# Patient Record
Sex: Female | Born: 1970 | Hispanic: No | Smoking: Never smoker
Health system: Southern US, Community
[De-identification: ages and names within clinical notes are randomized; demographics above are authoritative.]

## PROBLEM LIST (undated history)

## (undated) HISTORY — PX: APPENDECTOMY: SHX54

## (undated) HISTORY — PX: TUBAL LIGATION: SHX77

## (undated) HISTORY — PX: FOOT SURGERY: SHX648

## (undated) HISTORY — PX: CARPAL TUNNEL RELEASE: SHX101

## (undated) HISTORY — PX: ABDOMINAL HYSTERECTOMY: SHX81

## (undated) HISTORY — PX: BREAST SURGERY: SHX581

## (undated) HISTORY — PX: ABLATION: SHX5711

---

## 1998-02-09 ENCOUNTER — Emergency Department (HOSPITAL_COMMUNITY): Admission: EM | Admit: 1998-02-09 | Discharge: 1998-02-09 | Payer: Self-pay | Admitting: Emergency Medicine

## 2012-04-18 DIAGNOSIS — K648 Other hemorrhoids: Secondary | ICD-10-CM

## 2014-02-26 DIAGNOSIS — Z9013 Acquired absence of bilateral breasts and nipples: Secondary | ICD-10-CM

## 2014-06-25 DIAGNOSIS — F419 Anxiety disorder, unspecified: Secondary | ICD-10-CM

## 2014-06-25 DIAGNOSIS — M545 Low back pain, unspecified: Secondary | ICD-10-CM | POA: Insufficient documentation

## 2014-06-25 DIAGNOSIS — F319 Bipolar disorder, unspecified: Secondary | ICD-10-CM

## 2017-01-03 ENCOUNTER — Encounter: Payer: Self-pay | Admitting: Podiatry

## 2017-01-17 ENCOUNTER — Encounter: Payer: Self-pay | Admitting: Podiatry

## 2017-01-29 NOTE — Progress Notes (Signed)
This encounter was created in error - please disregard.

## 2017-02-05 ENCOUNTER — Ambulatory Visit (INDEPENDENT_AMBULATORY_CARE_PROVIDER_SITE_OTHER): Payer: Medicare Other

## 2017-02-05 ENCOUNTER — Ambulatory Visit (INDEPENDENT_AMBULATORY_CARE_PROVIDER_SITE_OTHER): Payer: Medicare Other | Admitting: Podiatry

## 2017-02-05 DIAGNOSIS — M722 Plantar fascial fibromatosis: Secondary | ICD-10-CM

## 2017-02-05 DIAGNOSIS — M79672 Pain in left foot: Principal | ICD-10-CM

## 2017-02-05 DIAGNOSIS — M79671 Pain in right foot: Principal | ICD-10-CM

## 2017-02-06 NOTE — Progress Notes (Signed)
   Subjective: Patient presents today for intermittent pain and tenderness in the bilateral plantar heels, left worse than right, that has been ongoing for the past 4 years. She states she is scheduled for bilateral EPF on Wednesday by a podiatrist in [facility]. She wants a second opinion. Patient presents today for further treatment and evaluation.   No past medical history on file.   Objective: Physical Exam General: The patient is alert and oriented x3 in no acute distress.  Dermatology: Skin is warm, dry and supple bilateral lower extremities. Negative for open lesions or macerations bilateral.   Vascular: Dorsalis Pedis and Posterior Tibial pulses palpable bilateral.  Capillary fill time is immediate to all digits.  Neurological: Epicritic and protective threshold intact bilateral.   Musculoskeletal: Tenderness to palpation at the medial calcaneal tubercale and through the insertion of the plantar fascia of the left foot. All other joints range of motion within normal limits bilateral. Strength 5/5 in all groups bilateral.    Assessment: 1. Plantar fasciitis left foot  Plan of Care:  1. Patient evaluated.  2. Recommended patient proceed with EPF surgery in [facility] by podiatrist. 3. Return to clinic when necessary.   Felecia Shelling, DPM [facility]  Dr. Felecia Shelling, DPM    [address] [place], [place] [address]                Office [phone]  Fax [phone]

## 2017-02-14 ENCOUNTER — Telehealth: Payer: Self-pay | Admitting: Podiatry

## 2017-02-15 ENCOUNTER — Telehealth: Payer: Self-pay | Admitting: Podiatry

## 2017-02-15 NOTE — Telephone Encounter (Signed)
Returned pt's phone call to discuss her request for her records to be sent to our office. Left voicemail saying she would need to sign a release form either with our office or the facility where she had her foot surgery at to have her records released to us. Asked pt to call me back directly at [phone] to let me know which she would rather do.

## 2017-02-15 NOTE — Telephone Encounter (Signed)
Called pt back in regards to her request of records she wants sent to our office. Pt stated she would come by the office to fill out and sign a medical records release form for us to fax to obtain her records. I told her to come by at her convenience that we are open until 5:00 pm today and we are open 7:30 am - 4:00 pm tomorrow.

## 2017-02-15 NOTE — Telephone Encounter (Signed)
[place] [facility] [place] week and I was wanting to get my records transferred up here since [place] to [place]. It would be easier on me. Please call me back at [phone]. Thank you.

## 2017-02-15 NOTE — Telephone Encounter (Signed)
Hello Shanda BumpsJessica, this is Kem Boroughsammy Bonham returning your call. My phone number is [phone]. Please give me a call back. Thank you.

## 2017-02-19 ENCOUNTER — Ambulatory Visit (INDEPENDENT_AMBULATORY_CARE_PROVIDER_SITE_OTHER): Payer: Medicare Other | Admitting: Podiatry

## 2017-02-19 VITALS — BP 104/64 | HR 101 | Temp 97.1°F

## 2017-02-19 DIAGNOSIS — M722 Plantar fascial fibromatosis: Secondary | ICD-10-CM

## 2017-02-19 DIAGNOSIS — Z9889 Other specified postprocedural states: Principal | ICD-10-CM

## 2017-02-21 NOTE — Progress Notes (Signed)
   Subjective:  Patient presents today status post left plantar fasciotomy performed [place] in [place]. DOS: 02/05/17. She states she is doing well and is here for further evaluation and treatment. Transfer of care was established today. Patient records faxed over to our office.  No past medical history on file.    Objective/Physical Exam Skin incisions appear to be well coapted with sutures and staples intact. No sign of infectious process noted. No dehiscence. No active bleeding noted. Moderate edema noted to the surgical extremity.   Assessment: 1. s/p left plantar fasciotomy performed [place] [facility]. DOS: 02/05/17.   Plan of Care:  1. Patient was evaluated.  2. Continue weightbearing cam boot. Discontinue knee scooter.  3. Recommended antibiotic ointment and Band-Aid daily. 4. Return to clinic in 1 week for suture removal.   Felecia ShellingBrent M. Leander Tout, DPM [facility]  Dr. Felecia ShellingBrent M. Davi Kroon, DPM    [address]                                        [place], [place] [address]                Office [phone]  Fax [phone](336) [phone]

## 2017-02-28 ENCOUNTER — Ambulatory Visit (INDEPENDENT_AMBULATORY_CARE_PROVIDER_SITE_OTHER): Payer: Medicare Other | Admitting: Podiatry

## 2017-02-28 DIAGNOSIS — M722 Plantar fascial fibromatosis: Secondary | ICD-10-CM

## 2017-02-28 DIAGNOSIS — Z9889 Other specified postprocedural states: Principal | ICD-10-CM

## 2017-03-02 NOTE — Progress Notes (Signed)
   Subjective:  Patient presents today status post left plantar fasciot[place] [facility]. DOS: 02/05/17. She states she is doing well but she is ready to discontinue wearing the boot. She is here for further evaluation and treatment.   No past medical history on file.     Objective/Physical Exam Skin incisions appear to be well coapted with sutures and staples intact. No sign of infectious process noted. No dehiscence. No active bleeding noted. Moderate edema noted to the surgical extremity.   Assessment: 1. s/p left plantar fasciotomy performed [place] [facility] [place]. DOS: 02/05/17.   Plan of Care:  1. Patient was evaluated.  2. Sutures were removed. 3. Return to normal shoe gear. Discontinue wearing cam boot. 4. Return to clinic when necessary.   Felecia ShellingBrent M. Verner Kopischke, DPM [facility]  Dr. Felecia ShellingBrent M. Eyal Greenhaw, DPM    [address]                                        [place], [place] [address]                Office [phone](336) [phone]  Fax [phone](336) [phone]

## 2017-05-28 ENCOUNTER — Encounter: Payer: Medicare Other | Admitting: Podiatry

## 2017-06-04 ENCOUNTER — Other Ambulatory Visit: Payer: Self-pay | Admitting: Podiatry

## 2017-06-04 ENCOUNTER — Ambulatory Visit (INDEPENDENT_AMBULATORY_CARE_PROVIDER_SITE_OTHER): Payer: Medicare Other | Admitting: Podiatry

## 2017-06-04 ENCOUNTER — Ambulatory Visit: Payer: Medicare Other

## 2017-06-04 DIAGNOSIS — R52 Pain, unspecified: Principal | ICD-10-CM

## 2017-06-04 DIAGNOSIS — B070 Plantar wart: Principal | ICD-10-CM

## 2017-06-04 DIAGNOSIS — M722 Plantar fascial fibromatosis: Secondary | ICD-10-CM

## 2017-06-04 MED ORDER — DICLOFENAC SODIUM 75 MG PO TBEC: 75.0000 mg | DELAYED_RELEASE_TABLET | Freq: Two times a day (BID) | ORAL | 1 refills | Status: DC

## 2017-06-04 NOTE — Progress Notes (Deleted)
le

## 2017-06-05 ENCOUNTER — Telehealth: Payer: Self-pay | Admitting: Podiatry

## 2017-06-05 MED ORDER — TRAMADOL HCL 50 MG PO TABS: 50.0000 mg | ORAL_TABLET | Freq: Four times a day (QID) | ORAL | 0 refills | Status: DC | PRN

## 2017-06-05 NOTE — Telephone Encounter (Signed)
I instructed pt to begin soak, and could ice the area 3-4 time daily for 15-20 minutes/session protecting the area from the ice with a sock and I would inform Dr. Logan BoresEvans.

## 2017-06-05 NOTE — Addendum Note (Signed)
Addended by: Alphia Kava'CONNELL, Phung Kotas D on: 06/05/2017 04:26 PM   Modules accepted: Orders

## 2017-06-05 NOTE — Telephone Encounter (Signed)
Left message informing pt Dr. Logan BoresEvans ordered Tramadol and it would be sent to [facility] [id number].

## 2017-06-05 NOTE — Telephone Encounter (Signed)
I was in yesterday and my foot is still in a lot of pain. I was wondering if there is any way he can send some pain medication in because the medication he gave me is not helping my foot. My number is [phone].

## 2017-06-05 NOTE — Telephone Encounter (Signed)
Dr. Philomena DohenyEvans okayed Tramadol 50mg  #30 one every 6 hours.

## 2017-06-05 NOTE — Telephone Encounter (Signed)
Faxed order for tramadol to [facility] [address].

## 2017-06-06 NOTE — Progress Notes (Signed)
   Subjective: Patient presents today for pain and tenderness in the left heel that began 3 weeks ago. She reports small nodules near the incision site of the left heel from plantar fasciotomy she had done [place] [facility] [place] on 02/05/17. Applying pressure to the foot increases the pain. There are no alleviating factors noted. She has not done anything to treat the symptoms. Patient presents today for further treatment and evaluation.   No past medical history on file.   Objective: Physical Exam General: The patient is alert and oriented x3 in no acute distress.  Dermatology: Hyperkeratotic skin lesion noted to the plantar aspect of the left foot approximately 1 cm in diameter. Pinpoint bleeding noted upon debridement. Skin is warm, dry and supple bilateral lower extremities. Negative for open lesions or macerations bilateral.   Vascular: Dorsalis Pedis and Posterior Tibial pulses palpable bilateral.  Capillary fill time is immediate to all digits.  Neurological: Epicritic and protective threshold intact bilateral.   Musculoskeletal: Tenderness to palpation at the medial calcaneal tubercale and through the insertion of the plantar fascia of the left foot. All other joints range of motion within normal limits bilateral. Strength 5/5 in all groups bilateral.    Assessment: 1. Plantar fasciitis left foot 2. Plantar wart left foot 3. H/o EPF 02/05/17   Plan of Care:  1. Patient evaluated.  2. Plantar fascial brace dispensed. 3. Prescription for Diclofenac provided to patient. 4. Injections in the past have not helped.  5. Excisional debridement of the plantar wart lesion was performed using a chisel blade. Cantharone was applied and the lesion was dressed with a dry sterile dressing. 6. Return to clinic in 2 weeks.    Felecia ShellingBrent M. Evans, DPM [facility]  Dr. Felecia ShellingBrent M. Evans, DPM    [address]                                     [place], [place] [address]                  Office [phone]  Fax [phone]

## 2017-06-18 ENCOUNTER — Ambulatory Visit (INDEPENDENT_AMBULATORY_CARE_PROVIDER_SITE_OTHER): Payer: Medicare Other | Admitting: Podiatry

## 2017-06-18 DIAGNOSIS — B070 Plantar wart: Principal | ICD-10-CM

## 2017-06-18 DIAGNOSIS — M722 Plantar fascial fibromatosis: Secondary | ICD-10-CM

## 2017-06-18 MED ORDER — TRAMADOL HCL 50 MG PO TABS: 50.0000 mg | ORAL_TABLET | Freq: Three times a day (TID) | ORAL | 0 refills | Status: DC | PRN

## 2017-06-20 NOTE — Progress Notes (Signed)
   Subjective: 47 year old female presenting today for follow up evaluation of left plantar fasciitis and a plantar wart of the left foot. She reports the blister burst over the past few days and now has itching of the area. She has bene wearing the fascial brace on the foot which caused the blister to rupture. Patient presents today for further treatment and evaluation.   No past medical history on file.   Objective: Physical Exam General: The patient is alert and oriented x3 in no acute distress.  Dermatology: Hyperkeratotic skin lesion noted to the plantar aspect of the left foot approximately 1 cm in diameter. Pinpoint bleeding noted upon debridement. Skin is warm, dry and supple bilateral lower extremities. Negative for open lesions or macerations bilateral.   Vascular: Dorsalis Pedis and Posterior Tibial pulses palpable bilateral.  Capillary fill time is immediate to all digits.  Neurological: Epicritic and protective threshold intact bilateral.   Musculoskeletal: Tenderness to palpation at the medial calcaneal tubercale and through the insertion of the plantar fascia of the left foot. All other joints range of motion within normal limits bilateral. Strength 5/5 in all groups bilateral.    Assessment: 1. Plantar fasciitis left foot 2. Plantar wart left foot - improved 3. H/o EPF 02/05/17   Plan of Care:  1. Patient evaluated.  2. Continue wearing plantar fascial brace. 3. Salinocaine applied to the wart lesion and dressed with a dry sterile dressing.  4. Recommended good shoe gear.  5. Refill prescription for tramadol 50 mg #60 provided to patient.  6. Return to clinic as needed.     Felecia ShellingBrent M. Emmajo Bennette, DPM [facility]  Dr. Felecia ShellingBrent M. Deborah Dondero, DPM    [address]                                     [place], [place] [address]                Office [phone]) [phone]  Fax [phone]

## 2017-07-09 NOTE — Progress Notes (Signed)
This encounter was created in error - please disregard.

## 2018-09-16 ENCOUNTER — Other Ambulatory Visit: Payer: Self-pay

## 2018-09-16 ENCOUNTER — Encounter: Payer: Self-pay | Admitting: Podiatry

## 2018-09-16 ENCOUNTER — Ambulatory Visit (INDEPENDENT_AMBULATORY_CARE_PROVIDER_SITE_OTHER): Payer: Medicare Other | Admitting: Podiatry

## 2018-09-16 VITALS — Temp 97.5°F

## 2018-09-16 DIAGNOSIS — M722 Plantar fascial fibromatosis: Secondary | ICD-10-CM

## 2018-09-16 DIAGNOSIS — R252 Cramp and spasm: Secondary | ICD-10-CM | POA: Diagnosis not present

## 2018-09-16 MED ORDER — MELOXICAM 15 MG PO TABS: 15.0000 mg | ORAL_TABLET | Freq: Every day | ORAL | 1 refills | Status: DC

## 2018-09-16 MED ORDER — CYCLOBENZAPRINE HCL 5 MG PO TABS: 5.0000 mg | ORAL_TABLET | Freq: Every day | ORAL | 1 refills | Status: DC

## 2018-09-18 NOTE — Progress Notes (Signed)
   Subjective: 48 y.o. female presenting today for follow up evaluation of left plantar fasciitis. She reports continued pain if she is on them for long periods of time. She has tried stretching but reports no significant improvement. She reports associated cramping of her toes that worsens at night. Patient is here for further evaluation and treatment.   History reviewed. No pertinent past medical history.   Objective: Physical Exam General: The patient is alert and oriented x3 in no acute distress.  Dermatology: Skin is warm, dry and supple bilateral lower extremities. Negative for open lesions or macerations bilateral.   Vascular: Dorsalis Pedis and Posterior Tibial pulses palpable bilateral.  Capillary fill time is immediate to all digits.  Neurological: Epicritic and protective threshold intact bilateral.   Musculoskeletal: Tenderness to palpation to the plantar aspect of the left heel along the plantar fascia. All other joints range of motion within normal limits bilateral. Strength 5/5 in all groups bilateral.   Assessment: 1. Plantar fasciitis left foot 2. Nocturnal foot cramps right   Plan of Care:  1. Patient evaluated.   2. Injection of 0.5cc Celestone soluspan injected into the left plantar fascia.  3. Rx for Flexeril 5mg  QHS provided to patient.  4. Rx for Meloxicam ordered for patient. 5. Plantar fascial band(s) dispensed  6. Instructed patient regarding therapies and modalities at home to alleviate symptoms.  7. Return to clinic in 4 weeks. If not better, I recommend surgery for the plantar fasciitis of the left foot.    Felecia Shelling, DPM [facility]  Dr. Felecia Shelling, DPM    [address] [place], [place] [address]                Office [phone]  Fax [phone]

## 2018-10-06 ENCOUNTER — Emergency Department (HOSPITAL_COMMUNITY): Payer: Medicare Other

## 2018-10-06 ENCOUNTER — Other Ambulatory Visit: Payer: Self-pay

## 2018-10-06 ENCOUNTER — Encounter (HOSPITAL_COMMUNITY): Payer: Self-pay | Admitting: *Deleted

## 2018-10-06 ENCOUNTER — Emergency Department (HOSPITAL_COMMUNITY)
Admission: EM | Admit: 2018-10-06 | Discharge: 2018-10-06 | Disposition: A | Discharge status: Home / Self Care | Payer: Medicare Other | Attending: Emergency Medicine | Admitting: Emergency Medicine

## 2018-10-06 DIAGNOSIS — S99921A Unspecified injury of right foot, initial encounter: Secondary | ICD-10-CM | POA: Diagnosis present

## 2018-10-06 DIAGNOSIS — S93691A Other sprain of right foot, initial encounter: Secondary | ICD-10-CM | POA: Insufficient documentation

## 2018-10-06 DIAGNOSIS — Y92008 Other place in unspecified non-institutional (private) residence as the place of occurrence of the external cause: Secondary | ICD-10-CM | POA: Insufficient documentation

## 2018-10-06 DIAGNOSIS — Z79899 Other long term (current) drug therapy: Secondary | ICD-10-CM | POA: Diagnosis not present

## 2018-10-06 DIAGNOSIS — Y9389 Activity, other specified: Secondary | ICD-10-CM | POA: Insufficient documentation

## 2018-10-06 DIAGNOSIS — X509XXA Other and unspecified overexertion or strenuous movements or postures, initial encounter: Secondary | ICD-10-CM | POA: Insufficient documentation

## 2018-10-06 DIAGNOSIS — S93601A Unspecified sprain of right foot, initial encounter: Secondary | ICD-10-CM

## 2018-10-06 DIAGNOSIS — Y999 Unspecified external cause status: Secondary | ICD-10-CM | POA: Insufficient documentation

## 2018-10-06 MED ORDER — NAPROXEN 500 MG PO TABS: 500.0000 mg | ORAL_TABLET | Freq: Two times a day (BID) | ORAL | 0 refills | Status: DC | PRN

## 2018-10-06 MED ORDER — IBUPROFEN 400 MG PO TABS
400.0000 mg | ORAL_TABLET | Freq: Once | ORAL | Status: AC
  Administered 2018-10-06: 01:00:00 400 mg via ORAL
  Filled 2018-10-06: qty 1

## 2018-10-06 NOTE — ED Provider Notes (Signed)
[facility] EMERGENCY DEPARTMENT Provider Note   CSN: [id number] Arrival date & time: 10/06/18  0051    History   Chief Complaint Chief Complaint  Patient presents with   Foot Pain    HPI Sandy Phillips is a 48 y.o. female.     With right-sided foot and ankle pain after twisting it coming up the stairs tonight.  States she was wearing slippers and lost her balance and her right foot turned inward.  She fell backwards and was caught by her friend and did not fall or hit her head or lose consciousness.  She complains of pain to the bottom of her right foot and medial right ankle.  This happened about midnight.  She denies hitting her head.  No neck or back pain.  No focal weakness, numbness or tingling.  She did not take anything at home for pain.  The history is provided by the patient.  Foot Pain  Pertinent negatives include no chest pain and no headaches.    History reviewed. No pertinent past medical history.  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      Home Medications    Prior to Admission medications   Medication Sig Start Date End Date Taking? Authorizing Provider  ALPRAZolam Duanne Moron) 0.5 MG tablet Take 0.5 mg by mouth at bedtime as needed for anxiety.    [provider]  cyclobenzaprine (FLEXERIL) 5 MG tablet Take 1 tablet (5 mg total) by mouth at bedtime. 09/16/18   Edrick Kins, DPM  diclofenac (VOLTAREN) 75 MG EC tablet TAKE 1 TABLET BY MOUTH TWICE DAILY 06/04/17   Edrick Kins, DPM  meloxicam (MOBIC) 15 MG tablet Take 1 tablet  (15 mg total) by mouth daily. 09/16/18   Edrick Kins, DPM  sertraline (ZOLOFT) 50 MG tablet Take by mouth. 01/29/17   [provider]  traMADol (ULTRAM) 50 MG tablet Take 1 tablet (50 mg total) by mouth every 8 (eight) hours as needed. 06/18/17   Edrick Kins, DPM    Family History No family history on file.  Social History Social History   Tobacco Use   Smoking status: Never Smoker   Smokeless tobacco: Never Used  Substance Use Topics   Alcohol use: Not on file   Drug use: Not on file     Allergies   Celexa  [citalopram hydrobromide]; Penicillins; Marcaine  [bupivacaine hcl]; Codeine; Lidocaine; and Hydrocodone-acetaminophen   Review of Systems Review of Systems  Constitutional: Negative for activity change, appetite change and fever.  HENT: Negative for congestion and rhinorrhea.   Respiratory: Negative for chest tightness.   Cardiovascular: Negative for chest pain.  Gastrointestinal: Negative for nausea and vomiting.  Genitourinary: Negative for dysuria.  Musculoskeletal: Positive for arthralgias and myalgias.  Skin: Negative for rash.  Neurological: Negative for dizziness, weakness and headaches.   all other systems are negative except as noted in the HPI and PMH.     Physical  Exam Updated Vital Signs BP 135/74 (BP Location: Left Arm)    Pulse (!) 101    Temp 98 F (36.7 C) (Oral)    Resp 18    Ht 5\' 2"  (1.575 m)    Wt 77.1 kg    SpO2 97%    BMI 31.09 kg/m   Physical Exam Vitals signs and nursing note reviewed.  Constitutional:      General: She is not in acute distress.    Appearance: She is well-developed.  HENT:     Head: Normocephalic and atraumatic.     Mouth/Throat:     Pharynx: No oropharyngeal exudate.  Eyes:     Conjunctiva/sclera: Conjunctivae normal.     Pupils: Pupils are equal, round, and reactive to light.  Neck:     Musculoskeletal: Normal range of motion and neck supple.     Comments: No C-spine tenderness Cardiovascular:       Rate and Rhythm: Normal rate and regular rhythm.     Heart sounds: Normal heart sounds. No murmur.  Pulmonary:     Effort: Pulmonary effort is normal. No respiratory distress.     Breath sounds: Normal breath sounds.  Abdominal:     Palpations: Abdomen is soft.     Tenderness: There is no abdominal tenderness. There is no guarding or rebound.  Musculoskeletal: Normal range of motion.        General: Tenderness present.     Comments: Tenderness to palpation of the right plantar foot without deformity.  Intact DP and PT pulse.  Achilles is intact.  Medial malleolar tenderness without swelling or deformity.  No proximal fibular tenderness  No T or L-spine tenderness  Skin:    General: Skin is warm.     Capillary Refill: Capillary refill takes less than 2 seconds.  Neurological:     General: No focal deficit present.     Mental Status: She is alert and oriented to person, place, and time. Mental status is at baseline.     Cranial Nerves: No cranial nerve deficit.     Motor: No abnormal muscle tone.     Coordination: Coordination normal.     Comments:  5/5 strength throughout. CN 2-12 intact.Equal grip strength.   Psychiatric:        Behavior: Behavior normal.      ED Treatments / Results  Labs (all labs ordered are listed, but only abnormal results are displayed) Labs Reviewed - No data to display  EKG None  Radiology Dg Ankle Complete Right  Result Date: 10/06/2018 CLINICAL DATA:  48 year old female with history of twist injury to the right foot complaining of pain on the bottom of the right foot. EXAM: RIGHT FOOT COMPLETE - 3+ VIEW; RIGHT ANKLE - COMPLETE 3+ VIEW COMPARISON:  02/05/2017. FINDINGS: There is no evidence of fracture or dislocation. There is no evidence of arthropathy or other focal bone abnormality. Soft tissues are unremarkable. IMPRESSION: Negative. Electronically Signed   By: Trudie Reedaniel  Entrikin M.D.   On: 10/06/2018 02:05   Dg Foot Complete Right  Result  Date: 10/06/2018 CLINICAL DATA:  48 year old female with history of twist injury to the right foot complaining of pain on the bottom of the right foot. EXAM: RIGHT FOOT COMPLETE - 3+ VIEW; RIGHT ANKLE - COMPLETE 3+ VIEW COMPARISON:  02/05/2017. FINDINGS: There is no evidence of fracture or dislocation. There is no evidence of arthropathy or other focal bone abnormality. Soft tissues are unremarkable. IMPRESSION: Negative. Electronically Signed   By: Reuel Boomaniel  Entrikin M.D.   On: 10/06/2018 02:05    Procedures Procedures (including critical care time)  Medications Ordered in ED Medications  ibuprofen (ADVIL) tablet 400 mg (has no administration in time range)     Initial Impression / Assessment and Plan / ED Course  I have reviewed the triage vital signs and the nursing notes.  Pertinent labs & imaging results that were available during my care of the patient were reviewed by me and considered in my medical decision making (see chart for details).       Fall with right foot pain.  Neurovascularly intact.  No other injuries.  X-ray of right foot and ankle are negative for fracture dislocation.  Patient will be treated for suspected sprain.  Discussed she may have damaged some the ligaments in her foot and should follow-up with the foot specialist.  She is given a hard soled shoe and crutches.  She will be treated with anti-inflammatories, ice, elevation.  Return precautions discussed. Final Clinical Impressions(s) / ED Diagnoses   Final diagnoses:  Sprain of right foot, initial encounter    ED Discharge Orders    None       [place], Jeannett SeniorStephen, MD 10/06/18 [phone]

## 2018-10-06 NOTE — Discharge Instructions (Signed)
Take the anti-inflammatories as prescribed and follow-up with the foot doctor.  Return to the ED with worsening pain, numbness, tingling, any other concerns.

## 2018-10-06 NOTE — ED Triage Notes (Signed)
Pt twisted right foot tonight, c/o pain to bottom of right foot,

## 2018-10-17 ENCOUNTER — Other Ambulatory Visit: Payer: Self-pay

## 2018-10-17 MED ORDER — MELOXICAM 15 MG PO TABS: 15.0000 mg | ORAL_TABLET | Freq: Every day | ORAL | 1 refills | Status: DC

## 2018-10-17 NOTE — Progress Notes (Signed)
Refill on Rx Meloxicam 15MG  30tab

## 2018-10-21 ENCOUNTER — Ambulatory Visit: Payer: Medicare Other | Admitting: Podiatry

## 2018-10-25 ENCOUNTER — Other Ambulatory Visit: Payer: Self-pay

## 2018-10-25 ENCOUNTER — Encounter: Payer: Self-pay | Admitting: Podiatry

## 2018-10-25 ENCOUNTER — Ambulatory Visit (INDEPENDENT_AMBULATORY_CARE_PROVIDER_SITE_OTHER): Payer: Medicare Other | Admitting: Podiatry

## 2018-10-25 VITALS — Temp 97.6°F

## 2018-10-25 DIAGNOSIS — M722 Plantar fascial fibromatosis: Secondary | ICD-10-CM

## 2018-10-25 DIAGNOSIS — R252 Cramp and spasm: Secondary | ICD-10-CM

## 2018-10-31 NOTE — Progress Notes (Signed)
Subjective:   Patient ID: Sandy Phillips, female   DOB: 48 y.o.   MRN: [id number]   HPI Patient states I seem to be improved but I am having discomfort if I am on my foot too long   ROS      Objective:  Physical Exam  Neurovascular status intact negative Homans sign noted with patient's foot moderately tender but only after activity or prolonged standing     Assessment:  Appears to be a local fascial process but does not appear to be something with acute intensity     Plan:  Spent a great deal time going over education of deformity considerations for long-term treatments but at this time we will utilize physical therapy anti-inflammatory support therapy and will be seen back as needed

## 2018-11-20 ENCOUNTER — Encounter: Payer: Self-pay | Admitting: Podiatry

## 2018-11-20 ENCOUNTER — Ambulatory Visit (INDEPENDENT_AMBULATORY_CARE_PROVIDER_SITE_OTHER): Payer: Medicare Other | Admitting: Podiatry

## 2018-11-20 ENCOUNTER — Other Ambulatory Visit: Payer: Self-pay

## 2018-11-20 DIAGNOSIS — M722 Plantar fascial fibromatosis: Secondary | ICD-10-CM | POA: Diagnosis not present

## 2018-11-20 NOTE — Patient Instructions (Signed)
Pre-Operative Instructions  Congratulations, you have decided to take an important step towards improving your quality of life.  You can be assured that the doctors and staff at [facility] will be with you every step of the way.  Here are some important things you should know:  1. Plan to be at the surgery center/hospital at least 1 (one) hour prior to your scheduled time, unless otherwise directed by the surgical center/hospital staff.  You must have a responsible adult accompany you, remain during the surgery and drive you home.  Make sure you have directions to the surgical center/hospital to ensure you arrive on time. 2. If you are having surgery at [facility] or [facility], you will need a copy of your medical history and physical form from your family physician within one month prior to the date of surgery. We will give you a form for your primary physician to complete.  3. We make every effort to accommodate the date you request for surgery.  However, there are times where surgery dates or times have to be moved.  We will contact you as soon as possible if a change in schedule is required.   4. No aspirin/ibuprofen for one week before surgery.  If you are on aspirin, any non-steroidal anti-inflammatory medications (Mobic, Aleve, Ibuprofen) should not be taken seven (7) days prior to your surgery.  You make take Tylenol for pain prior to surgery.  5. Medications - If you are taking daily heart and blood pressure medications, seizure, reflux, allergy, asthma, anxiety, pain or diabetes medications, make sure you notify the surgery center/hospital before the day of surgery so they can tell you which medications you should take or avoid the day of surgery. 6. No food or drink after midnight the night before surgery unless directed otherwise by surgical center/hospital staff. 7. No alcoholic beverages 24-hours prior to surgery.  No smoking 24-hours prior or 24-hours after  surgery. 8. Wear loose pants or shorts. They should be loose enough to fit over bandages, boots, and casts. 9. Don't wear slip-on shoes. Sneakers are preferred. 10. Bring your boot with you to the surgery center/hospital.  Also bring crutches or a walker if your physician has prescribed it for you.  If you do not have this equipment, it will be provided for you after surgery. 11. If you have not been contacted by the surgery center/hospital by the day before your surgery, call to confirm the date and time of your surgery. 12. Leave-time from work may vary depending on the type of surgery you have.  Appropriate arrangements should be made prior to surgery with your employer. 13. Prescriptions will be provided immediately following surgery by your doctor.  Fill these as soon as possible after surgery and take the medication as directed. Pain medications will not be refilled on weekends and must be approved by the doctor. 14. Remove nail polish on the operative foot and avoid getting pedicures prior to surgery. 15. Wash the night before surgery.  The night before surgery wash the foot and leg well with water and the antibacterial soap provided. Be sure to pay special attention to beneath the toenails and in between the toes.  Wash for at least three (3) minutes. Rinse thoroughly with water and dry well with a towel.  Perform this wash unless told not to do so by your physician.  Enclosed: 1 Ice pack (please put in freezer the night before surgery)   1 Hibiclens skin cleaner     Pre-op instructions  If you have any questions regarding the instructions, please do not hesitate to call our office.  [place]: [address], [place], [place] [address] -- [phone]  [place]: [address] [place], [place] [address] -- [phone]  [place]: [address]  [place], [place] [address] -- [phone]  [place]: [address], [address], [place], [place] [address] -- [phone]  Website:  [email or web address] 

## 2018-11-27 NOTE — Progress Notes (Signed)
   Subjective: 48 y.o. female presenting today for follow up evaluation of bilateral plantar fasciitis, right worse than left. She states the pain has not changed since her last visit with Dr. Paulla Dolly on 10/25/2018. She reports using the fascial brace but states it rubs the posterior heel. She has been taking anti-inflammatories for treatment. Patient is here for further evaluation and treatment.   History reviewed. No pertinent past medical history.   Objective: Physical Exam General: The patient is alert and oriented x3 in no acute distress.  Dermatology: Skin is warm, dry and supple bilateral lower extremities. Negative for open lesions or macerations bilateral.   Vascular: Dorsalis Pedis and Posterior Tibial pulses palpable bilateral.  Capillary fill time is immediate to all digits.  Neurological: Epicritic and protective threshold intact bilateral.   Musculoskeletal: Tenderness to palpation to the plantar aspect of the right heel along the plantar fascia. All other joints range of motion within normal limits bilateral. Strength 5/5 in all groups bilateral.   Assessment: 1. Plantar fasciitis right 2. H/o EPF left October 2018  Plan of Care:  1. Patient evaluated.  2. Today we discussed the conservative versus surgical management of the presenting pathology. The patient opts for surgical management. All possible complications and details of the procedure were explained. All patient questions were answered. No guarantees were expressed or implied. 3. Authorization for surgery was initiated today. Surgery will consist of EPF right.  4. Return to clinic one week post op.     Edrick Kins, DPM [facility]  Dr. Edrick Kins, DPM    [address], [place] [address]                Office [phone]  Fax [phone]

## 2018-12-29 DIAGNOSIS — Z720 Tobacco use: Secondary | ICD-10-CM | POA: Insufficient documentation

## 2019-01-09 ENCOUNTER — Encounter: Payer: Self-pay | Admitting: Podiatry

## 2019-01-09 ENCOUNTER — Other Ambulatory Visit: Payer: Self-pay | Admitting: Podiatry

## 2019-01-09 DIAGNOSIS — M722 Plantar fascial fibromatosis: Secondary | ICD-10-CM

## 2019-01-09 MED ORDER — ZOLPIDEM TARTRATE 5 MG PO TABS: 5.0000 mg | ORAL_TABLET | Freq: Every evening | ORAL | 1 refills | Status: DC | PRN

## 2019-01-09 MED ORDER — OXYCODONE-ACETAMINOPHEN 5-325 MG PO TABS: 1.0000 | ORAL_TABLET | Freq: Four times a day (QID) | ORAL | 0 refills | Status: DC | PRN

## 2019-01-09 NOTE — Progress Notes (Signed)
.  postop

## 2019-01-10 ENCOUNTER — Telehealth: Payer: Self-pay | Admitting: Podiatry

## 2019-01-10 NOTE — Telephone Encounter (Signed)
Pt called stating the boot is digging into her and she had to put padding under her heel. I told her to come in today by 4:30 to see one of the assistants that she may need a different sized boot. Instructed her to stop at the check in desk and let them know.

## 2019-01-13 ENCOUNTER — Other Ambulatory Visit: Payer: Self-pay

## 2019-01-13 ENCOUNTER — Ambulatory Visit (INDEPENDENT_AMBULATORY_CARE_PROVIDER_SITE_OTHER): Payer: Self-pay | Admitting: Podiatry

## 2019-01-13 ENCOUNTER — Encounter: Payer: Self-pay | Admitting: Podiatry

## 2019-01-13 DIAGNOSIS — M722 Plantar fascial fibromatosis: Secondary | ICD-10-CM

## 2019-01-13 DIAGNOSIS — Z9889 Other specified postprocedural states: Secondary | ICD-10-CM

## 2019-01-15 NOTE — Progress Notes (Signed)
   Subjective:  Patient presents today status post EPF right. DOS: 01/09/2019. She states she is doing well. She reports some mild tenderness but denies modifying factors. She has been using the CAM boot as directed. Patient is here for further evaluation and treatment.    History reviewed. No pertinent past medical history.    Objective/Physical Exam Neurovascular status intact.  Skin incisions appear to be well coapted with sutures and staples intact. No sign of infectious process noted. No dehiscence. No active bleeding noted. Moderate edema noted to the surgical extremity.  Assessment: 1. s/p EPF right. DOS: 01/09/2019 2. H/o EPF left October 2018   Plan of Care:  1. Patient was evaluated. X-rays reviewed 2. Dressing changed.  3. Continue weightbearing in CAM boot.  4. Return to clinic in one week for suture removal.    Edrick Kins, DPM [facility]  Dr. Edrick Kins, [address]                                        [place], [place] [address]                Office [phone]  Fax [phone]

## 2019-01-16 ENCOUNTER — Telehealth: Payer: Self-pay | Admitting: Podiatry

## 2019-01-16 NOTE — Telephone Encounter (Signed)
Pt was seen in office earlier this week and had her foot wrapped in an Ace bandage but noticed she is starting to get a rash/bumps on the top of her foot. Pt believes she is having a reaction to the Ace bandage and wants to know what she should do. Pt had surgery on 01/09/19.   Please give patient a call

## 2019-01-16 NOTE — Telephone Encounter (Signed)
I told pt that if she was continuing to have swelling she would need to wear a thin cotton sock beneath the ace wrap, but if no swelling she could try to go without the ace wrap. Pt states understanding and requested pain medication that was to be called in.

## 2019-01-22 ENCOUNTER — Ambulatory Visit (INDEPENDENT_AMBULATORY_CARE_PROVIDER_SITE_OTHER): Payer: Medicare Other | Admitting: Podiatry

## 2019-01-22 ENCOUNTER — Encounter: Payer: Self-pay | Admitting: Podiatry

## 2019-01-22 ENCOUNTER — Other Ambulatory Visit: Payer: Self-pay

## 2019-01-22 DIAGNOSIS — M722 Plantar fascial fibromatosis: Secondary | ICD-10-CM

## 2019-01-22 DIAGNOSIS — Z9889 Other specified postprocedural states: Secondary | ICD-10-CM

## 2019-01-22 MED ORDER — OXYCODONE-ACETAMINOPHEN 5-325 MG PO TABS: 1.0000 | ORAL_TABLET | Freq: Four times a day (QID) | ORAL | 0 refills | Status: DC | PRN

## 2019-01-26 NOTE — Progress Notes (Signed)
   Subjective:  Patient presents today status post EPF right. DOS: 01/09/2019. She states she is doing well. She reports some mild intermittent soreness but states it is not significant. She has been using the CAM boot as directed and denies any worsening factors. Patient is here for further evaluation and treatment.   History reviewed. No pertinent past medical history.    Objective/Physical Exam Neurovascular status intact.  Skin incisions appear to be well coapted with sutures and staples intact. No sign of infectious process noted. No dehiscence. No active bleeding noted. Moderate edema noted to the surgical extremity.  Assessment: 1. s/p EPF right. DOS: 01/09/2019 2. H/o EPF left October 2018   Plan of Care:  1. Patient was evaluated.  2. Sutures removed.  3. Continue using CAM boot for two weeks.  4. Refill prescription for Percocet 5/325 mg.  5. Return to clinic in 4 weeks.    Edrick Kins, DPM [facility]  Dr. Edrick Kins, [place]                                        [place], [place] [address]                Office [phone]  Fax [phone]

## 2019-01-28 ENCOUNTER — Emergency Department (HOSPITAL_COMMUNITY)
Admission: EM | Admit: 2019-01-28 | Discharge: 2019-01-28 | Discharge status: Absent without leave | Payer: Medicare Other

## 2019-01-28 ENCOUNTER — Ambulatory Visit
Admission: EM | Admit: 2019-01-28 | Discharge: 2019-01-28 | Disposition: A | Discharge status: Home / Self Care | Payer: Medicare Other | Attending: Emergency Medicine | Admitting: Emergency Medicine

## 2019-01-28 ENCOUNTER — Other Ambulatory Visit: Payer: Self-pay

## 2019-01-28 DIAGNOSIS — K047 Periapical abscess without sinus: Secondary | ICD-10-CM | POA: Diagnosis not present

## 2019-01-28 DIAGNOSIS — K0889 Other specified disorders of teeth and supporting structures: Secondary | ICD-10-CM | POA: Diagnosis not present

## 2019-01-28 MED ORDER — CLINDAMYCIN HCL 300 MG PO CAPS: 300.0000 mg | ORAL_CAPSULE | Freq: Four times a day (QID) | ORAL | 0 refills | Status: AC

## 2019-01-28 MED ORDER — KETOROLAC TROMETHAMINE 60 MG/2ML IM SOLN
60.0000 mg | Freq: Once | INTRAMUSCULAR | Status: AC
  Administered 2019-01-28: 18:00:00 60 mg via INTRAMUSCULAR

## 2019-01-28 NOTE — ED Provider Notes (Signed)
[facility]   [id number] 01/28/19 Arrival Time: 1734  CC: DENTAL PAIN  SUBJECTIVE:  Sandy Phillips is a 48 y.o. female who presents with RT upper tooth pain x 3 days.  Denies a precipitating event or trauma.  Localizes pain to top RT tooth. Describes as constant and throbbing. Has tried percocet without relief.  Worse with chewing.  Does not see a dentist regularly.  Reports similar symptoms in the past.  Denies fever, chills, dysphagia, odynophagia, oral or neck swelling, nausea, vomiting, chest pain, SOB.    ROS: As per HPI.  All other pertinent ROS negative.     History reviewed. No pertinent past medical history. Past Surgical History:  Procedure Laterality Date  . ABDOMINAL HYSTERECTOMY    . ABLATION    . APPENDECTOMY    . BREAST SURGERY    . CARPAL TUNNEL RELEASE    . FOOT SURGERY    . TUBAL LIGATION     Allergies  Allergen Reactions  . Celexa  [Citalopram Hydrobromide]     Other reaction(s): Chest Pain  . Penicillins Anaphylaxis and Rash  . Bupropion Anxiety, Nausea Only and Palpitations  . Marcaine  [Bupivacaine Hcl]     Other reaction(s): Other blisters  . Codeine Hives  . Lidocaine     Pt stated "Allergic to lidocaine injections in mouth, causes blisters in mouth"   . Hydrocodone-Acetaminophen Rash   No current facility-administered medications on file prior to encounter.    Current Outpatient Medications on File Prior to Encounter  Medication Sig Dispense Refill  . ALPRAZolam (XANAX) 0.5 MG tablet Take 0.5 mg by mouth at bedtime as needed for anxiety.    . cyclobenzaprine (FLEXERIL) 5 MG tablet Take 1 tablet (5 mg total) by mouth at bedtime. 30 tablet 1  . diclofenac (VOLTAREN) 75 MG EC tablet TAKE 1 TABLET BY MOUTH TWICE DAILY 180 tablet 1  . meloxicam (MOBIC) 15 MG tablet Take 1 tablet (15 mg total) by mouth daily. 30 tablet 1  . naproxen (NAPROSYN) 500 MG tablet Take 1 tablet (500 mg total) by mouth 2 (two) times daily as needed. 30 tablet 0  .  oxyCODONE-acetaminophen (PERCOCET) 5-325 MG tablet Take 1 tablet by mouth every 6 (six) hours as needed for severe pain. 30 tablet 0  . sertraline (ZOLOFT) 50 MG tablet Take by mouth.    . zolpidem (AMBIEN) 5 MG tablet Take 1 tablet (5 mg total) by mouth at bedtime as needed for sleep. 15 tablet 1   Social History   Socioeconomic History  . Marital status: Unknown    Spouse name: Not on file  . Number of children: Not on file  . Years of education: Not on file  . Highest education level: Not on file  Occupational History  . Not on file  Social Needs  . Financial resource strain: Not on file  . Food insecurity    Worry: Not on file    Inability: Not on file  . Transportation needs    Medical: Not on file    Non-medical: Not on file  Tobacco Use  . Smoking status: Never Smoker  . Smokeless tobacco: Never Used  Substance and Sexual Activity  . Alcohol use: Not on file  . Drug use: Not on file  . Sexual activity: Not on file  Lifestyle  . Physical activity    Days per week: Not on file    Minutes per session: Not on file  . Stress: Not on file  Relationships  . Social Herbalist on phone: Not on file    Gets together: Not on file    Attends religious service: Not on file    Active member of club or organization: Not on file    Attends meetings of clubs or organizations: Not on file    Relationship status: Not on file  . Intimate partner violence    Fear of current or ex partner: Not on file    Emotionally abused: Not on file    Physically abused: Not on file    Forced sexual activity: Not on file  Other Topics Concern  . Not on file  Social History Narrative  . Not on file   History reviewed. No pertinent family history.  OBJECTIVE:  Vitals:   01/28/19 1744  BP: 120/80  Pulse: 96  Resp: 18  Temp: 98 F (36.7 C)  TempSrc: Oral  SpO2: 98%    General appearance: alert; no distress HENT: normocephalic; atraumatic; dentition: poor; dental caries  over right upper gums without areas of fluctuance Neck: supple without LAD CV: RRR Lungs: normal respirations; CTAB Skin: warm and dry Psychological: alert and cooperative; normal mood and affect  ASSESSMENT & PLAN:  1. Dental infection   2. Pain, dental     Meds ordered this encounter  Medications  . clindamycin (CLEOCIN) 300 MG capsule    Sig: Take 1 capsule (300 mg total) by mouth every 6 (six) hours for 10 days.    Dispense:  40 capsule    Refill:  0    Order Specific Question:   Supervising Provider    Answer:   Raylene Everts [[id number]]  . ketorolac (TORADOL) injection 60 mg    Toradol shot given in office Cleocin prescribed.  Take as prescribed and to completion Recommend soft diet until evaluated by dentist Maintain oral hygiene care Follow up with dentist as soon as possible for further evaluation and treatment  Return or go to the ED if you have any new or worsening symptoms such as fever, chills, difficulty swallowing, painful swallowing, oral or neck swelling, nausea, vomiting, chest pain, SOB, etc...  Reviewed expectations re: course of current medical issues. Questions answered. Outlined signs and symptoms indicating need for more acute intervention. Patient verbalized understanding. After Visit Summary given.   Lestine Box, PA-C 01/28/19 1807

## 2019-01-28 NOTE — Discharge Instructions (Signed)
Toradol shot given in office Cleocin prescribed.  Take as prescribed and to completion Recommend soft diet until evaluated by dentist Maintain oral hygiene care Follow up with dentist as soon as possible for further evaluation and treatment  Return or go to the ED if you have any new or worsening symptoms such as fever, chills, difficulty swallowing, painful swallowing, oral or neck swelling, nausea, vomiting, chest pain, SOB, etc..Marland Kitchen

## 2019-01-28 NOTE — ED Triage Notes (Signed)
Pt has right upper tooth pain that began Friday

## 2019-01-31 NOTE — ED Provider Notes (Signed)
Patient called, is not tolerating the clindamycin.  States she is unable to swallow pills and is vomiting it up when she tries to dissolve the capsules.  Called in Zofran 8 mg 3 times daily #20 to the pharmacy on record.  We can also try liquid clindamycin 300 mg 4 times daily for 1 week.  #420 mL.  Discussed this with the pharmacist, patient will need to go to [facility] to get clindamycin but they have the Zofran available.  Melynda Ripple, MD   Melynda Ripple, MD 01/31/19 [phone]

## 2019-02-19 ENCOUNTER — Other Ambulatory Visit: Payer: Self-pay

## 2019-02-19 ENCOUNTER — Ambulatory Visit (INDEPENDENT_AMBULATORY_CARE_PROVIDER_SITE_OTHER): Payer: Medicare Other | Admitting: Podiatry

## 2019-02-19 DIAGNOSIS — M722 Plantar fascial fibromatosis: Secondary | ICD-10-CM | POA: Diagnosis not present

## 2019-02-19 DIAGNOSIS — Z9889 Other specified postprocedural states: Secondary | ICD-10-CM

## 2019-02-19 MED ORDER — IBUPROFEN 800 MG PO TABS: 800.0000 mg | ORAL_TABLET | Freq: Three times a day (TID) | ORAL | 1 refills | Status: DC

## 2019-02-23 NOTE — Progress Notes (Signed)
   Subjective:  Patient presents today status post EPF right. DOS: 01/09/2019. She reports some pain in the heel that is caused by ambulating without the CAM boot. She has been taking Percocet which helps alleviate the symptoms. Patient is here for further evaluation and treatment.   No past medical history on file.    Objective/Physical Exam Neurovascular status intact.  Skin incisions appear to be well coapted. No sign of infectious process noted. No dehiscence. No active bleeding noted. Moderate edema noted to the surgical extremity.  Assessment: 1. s/p EPF right. DOS: 01/09/2019 2. H/o EPF left October 2018   Plan of Care:  1. Patient was evaluated.  2. Resume using CAM boot for four weeks.  3. Short CAM boot dispensed.  4. Return to clinic in 4 weeks.     Edrick Kins, DPM [facility]  Dr. Edrick Kins, [place]                                        [place], [place] [address]                Office [phone]  Fax [phone]

## 2019-03-03 ENCOUNTER — Ambulatory Visit
Admission: EM | Admit: 2019-03-03 | Discharge: 2019-03-03 | Disposition: A | Discharge status: Home / Self Care | Payer: Medicare Other | Attending: Emergency Medicine | Admitting: Emergency Medicine

## 2019-03-03 ENCOUNTER — Other Ambulatory Visit: Payer: Self-pay

## 2019-03-03 DIAGNOSIS — B370 Candidal stomatitis: Secondary | ICD-10-CM

## 2019-03-03 MED ORDER — NYSTATIN 100000 UNIT/ML MT SUSP: 500000.0000 [IU] | Freq: Four times a day (QID) | OROMUCOSAL | 0 refills | Status: DC

## 2019-03-03 NOTE — ED Provider Notes (Signed)
[facility]   [id number] 03/03/19 Arrival Time: 1759  CC: "Thrush"  SUBJECTIVE:  Sandy Phillips is a 48 y.o. female who presents with white spots on tongue x 2 weeks.  Recently finished a course of antibiotics for possible dental infection. Localizes white spots to tongue.  Has NOT tried OTC medications.  Worse with eating spicy foods.  Denies similar symptoms in the past.  Denies fever, chills, dysphagia, odynophagia, oral or neck swelling, nausea, vomiting, chest pain, SOB.    ROS: As per HPI.  All other pertinent ROS negative.     History reviewed. No pertinent past medical history. Past Surgical History:  Procedure Laterality Date  . ABDOMINAL HYSTERECTOMY    . ABLATION    . APPENDECTOMY    . BREAST SURGERY    . CARPAL TUNNEL RELEASE    . FOOT SURGERY    . TUBAL LIGATION     Allergies  Allergen Reactions  . Celexa  [Citalopram Hydrobromide]     Other reaction(s): Chest Pain  . Penicillins Anaphylaxis and Rash  . Bupropion Anxiety, Nausea Only and Palpitations  . Marcaine  [Bupivacaine Hcl]     Other reaction(s): Other blisters  . Codeine Hives  . Lidocaine     Pt stated "Allergic to lidocaine injections in mouth, causes blisters in mouth"   . Hydrocodone-Acetaminophen Rash   No current facility-administered medications on file prior to encounter.    Current Outpatient Medications on File Prior to Encounter  Medication Sig Dispense Refill  . ALPRAZolam (XANAX) 0.5 MG tablet Take 0.5 mg by mouth at bedtime as needed for anxiety.    . clindamycin (CLEOCIN) 75 MG/5ML solution TK 20 ML PO QID FOR 7 DAYS DR    . cyclobenzaprine (FLEXERIL) 5 MG tablet Take 1 tablet (5 mg total) by mouth at bedtime. 30 tablet 1  . diclofenac (VOLTAREN) 75 MG EC tablet TAKE 1 TABLET BY MOUTH TWICE DAILY 180 tablet 1  . ibuprofen (ADVIL) 800 MG tablet Take 1 tablet (800 mg total) by mouth 3 (three) times daily. 90 tablet 1  . meloxicam (MOBIC) 15 MG tablet Take 1 tablet (15 mg total)  by mouth daily. 30 tablet 1  . naproxen (NAPROSYN) 500 MG tablet Take 1 tablet (500 mg total) by mouth 2 (two) times daily as needed. 30 tablet 0  . Omega-3 Fatty Acids (FISH OIL) 1000 MG CAPS Take by mouth.    . ondansetron (ZOFRAN-ODT) 8 MG disintegrating tablet TK 1 T PO TID    . oxyCODONE-acetaminophen (PERCOCET) 5-325 MG tablet Take 1 tablet by mouth every 6 (six) hours as needed for severe pain. 30 tablet 0  . sertraline (ZOLOFT) 50 MG tablet Take by mouth.    . zolpidem (AMBIEN) 5 MG tablet Take 1 tablet (5 mg total) by mouth at bedtime as needed for sleep. 15 tablet 1   Social History   Socioeconomic History  . Marital status: Married    Spouse name: Not on file  . Number of children: Not on file  . Years of education: Not on file  . Highest education level: Not on file  Occupational History  . Not on file  Social Needs  . Financial resource strain: Not on file  . Food insecurity    Worry: Not on file    Inability: Not on file  . Transportation needs    Medical: Not on file    Non-medical: Not on file  Tobacco Use  . Smoking status: Never Smoker  .  Smokeless tobacco: Never Used  Substance and Sexual Activity  . Alcohol use: Not on file  . Drug use: Not on file  . Sexual activity: Not on file  Lifestyle  . Physical activity    Days per week: Not on file    Minutes per session: Not on file  . Stress: Not on file  Relationships  . Social Herbalist on phone: Not on file    Gets together: Not on file    Attends religious service: Not on file    Active member of club or organization: Not on file    Attends meetings of clubs or organizations: Not on file    Relationship status: Not on file  . Intimate partner violence    Fear of current or ex partner: Not on file    Emotionally abused: Not on file    Physically abused: Not on file    Forced sexual activity: Not on file  Other Topics Concern  . Not on file  Social History Narrative  . Not on file    History reviewed. No pertinent family history.  OBJECTIVE:  Vitals:   03/03/19 1817  BP: 111/77  Pulse: (!) 104  Resp: 20  Temp: 98.1 F (36.7 C)  SpO2: 94%    General appearance: alert; no distress HENT: normocephalic; atraumatic; PERRL, EOMI grossly; nares patent without rhinorrhea; dentition: poor; tongue with white patches, scrape off with tongue depressor; oropharynx clear, not erythematous Neck: supple without LAD Lungs: normal respirations; CTAB CV: RRR Skin: warm and dry Psychological: alert and cooperative; normal mood and affect  ASSESSMENT & PLAN:  1. Thrush     Meds ordered this encounter  Medications  . nystatin (MYCOSTATIN) 100000 UNIT/ML suspension    Sig: Take 5 mLs (500,000 Units total) by mouth 4 (four) times daily.    Dispense:  473 mL    Refill:  0    Order Specific Question:   Supervising Provider    Answer:   Raylene Everts [[id number]]   Nystatin prescribed.  Use as directed for between 10-14 days or until symptoms resolve Maintain oral hygiene Follow up with PCP as needed Return or go to the ED if you have any new or worsening symptoms such as fever, chills, nausea, vomiting, difficulty swallowing, fatigue, lymph node swelling, unintentional weight loss, excessive night sweats, etc...   Reviewed expectations re: course of current medical issues. Questions answered. Outlined signs and symptoms indicating need for more acute intervention. Patient verbalized understanding. After Visit Summary given.   Lestine Box, PA-C 03/03/19 1932

## 2019-03-03 NOTE — Discharge Instructions (Addendum)
Nystatin prescribed.  Use as directed for between 10-14 days or until symptoms resolve Maintain oral hygiene Follow up with PCP as needed Return or go to the ED if you have any new or worsening symptoms such as fever, chills, nausea, vomiting, difficulty swallowing, fatigue, lymph node swelling, unintentional weight loss, excessive night sweats, etc... 

## 2019-03-03 NOTE — ED Triage Notes (Signed)
Pt presents with white spots on tongue that she has had for 2 weeks

## 2019-03-19 ENCOUNTER — Ambulatory Visit: Payer: Medicare Other | Admitting: Podiatry

## 2019-05-28 ENCOUNTER — Ambulatory Visit (INDEPENDENT_AMBULATORY_CARE_PROVIDER_SITE_OTHER): Payer: Medicare Other | Admitting: Podiatry

## 2019-05-28 ENCOUNTER — Ambulatory Visit (INDEPENDENT_AMBULATORY_CARE_PROVIDER_SITE_OTHER): Payer: Medicare Other

## 2019-05-28 ENCOUNTER — Other Ambulatory Visit: Payer: Self-pay

## 2019-05-28 DIAGNOSIS — M79671 Pain in right foot: Secondary | ICD-10-CM | POA: Diagnosis not present

## 2019-05-28 DIAGNOSIS — M722 Plantar fascial fibromatosis: Secondary | ICD-10-CM

## 2019-05-28 NOTE — Patient Instructions (Signed)

## 2019-05-30 ENCOUNTER — Other Ambulatory Visit: Payer: Self-pay | Admitting: Podiatry

## 2019-05-30 DIAGNOSIS — M722 Plantar fascial fibromatosis: Secondary | ICD-10-CM

## 2019-05-31 NOTE — Progress Notes (Signed)
   Subjective:  Patient presents today status post EPF right. DOS: 01/09/2019. She reports some intermittent sharp pain of the medial plantar foot that began about four months ago. Walking increases the pain. She has not done anything for treatment. Patient is here for further evaluation and treatment.   No past medical history on file.    Objective/Physical Exam Neurovascular status intact.  Skin incisions appear to be well coapted. No sign of infectious process noted. No dehiscence. No active bleeding noted. Moderate edema noted to the surgical extremity.  Assessment: 1. s/p EPF right. DOS: 01/09/2019 2. H/o EPF left October 2018   Plan of Care:  1. Patient was evaluated.  2. Plantar fascial brace dispense.d  3. Prescription for Motrin 800 mg provided to patient.  4. Continue stretching exercises and wearing good shoe gear.  5. Return to clinic in 6 weeks.     Felecia Shelling, DPM [facility]  Dr. Felecia Shelling, DPM    [address]                                        [place], [place] [address]                Office [phone]  Fax [phone]

## 2019-07-09 ENCOUNTER — Ambulatory Visit (INDEPENDENT_AMBULATORY_CARE_PROVIDER_SITE_OTHER): Payer: Medicare Other | Admitting: Podiatry

## 2019-07-09 ENCOUNTER — Other Ambulatory Visit: Payer: Self-pay

## 2019-07-09 DIAGNOSIS — L600 Ingrowing nail: Secondary | ICD-10-CM

## 2019-07-09 DIAGNOSIS — M722 Plantar fascial fibromatosis: Secondary | ICD-10-CM

## 2019-07-09 MED ORDER — GENTAMICIN SULFATE 0.1 % EX CREA: 1.0000 "application " | TOPICAL_CREAM | Freq: Two times a day (BID) | CUTANEOUS | 1 refills | Status: DC

## 2019-07-09 NOTE — Patient Instructions (Signed)

## 2019-07-10 ENCOUNTER — Other Ambulatory Visit: Payer: Self-pay

## 2019-07-10 ENCOUNTER — Ambulatory Visit
Admission: EM | Admit: 2019-07-10 | Discharge: 2019-07-10 | Disposition: A | Discharge status: Home / Self Care | Payer: Medicare Other | Attending: Emergency Medicine | Admitting: Emergency Medicine

## 2019-07-10 DIAGNOSIS — K0889 Other specified disorders of teeth and supporting structures: Secondary | ICD-10-CM

## 2019-07-10 DIAGNOSIS — K047 Periapical abscess without sinus: Secondary | ICD-10-CM

## 2019-07-10 DIAGNOSIS — S032XXA Dislocation of tooth, initial encounter: Secondary | ICD-10-CM

## 2019-07-10 MED ORDER — MELOXICAM 7.5 MG PO TABS: 7.5000 mg | ORAL_TABLET | Freq: Every day | ORAL | 0 refills | Status: DC

## 2019-07-10 MED ORDER — KETOROLAC TROMETHAMINE 60 MG/2ML IM SOLN
60.0000 mg | Freq: Once | INTRAMUSCULAR | Status: AC
  Administered 2019-07-10: 60 mg via INTRAMUSCULAR

## 2019-07-10 MED ORDER — CHLORHEXIDINE GLUCONATE 0.12 % MT SOLN: 15.0000 mL | Freq: Two times a day (BID) | OROMUCOSAL | 0 refills | Status: AC

## 2019-07-10 MED ORDER — CLINDAMYCIN PALMITATE HCL 75 MG/5ML PO SOLR: 300.0000 mg | Freq: Four times a day (QID) | ORAL | 0 refills | Status: AC

## 2019-07-10 NOTE — ED Triage Notes (Signed)
Pt reports broke a tooth a few days ago and started having pain today.  Reports has appt with dentist on Monday.

## 2019-07-10 NOTE — ED Provider Notes (Signed)
[facility]   [id number] 07/10/19 Arrival Time: 1608  CC: DENTAL PAIN  SUBJECTIVE:  Sandy Phillips is a 49 y.o. female who presents with dental pain x 1 day.  Symptoms began after tooth broke a few days ago while chewing gum.  Localizes pain to top RT side.  Has tried OTC analgesics without relief.  Worse with chewing.  Has appt with dentist on Monday.  Reports similar symptoms in the past.  Denies fever, chills, dysphagia, odynophagia, oral or neck swelling, nausea, vomiting, chest pain, SOB.    ROS: As per HPI.  All other pertinent ROS negative.     History reviewed. No pertinent past medical history. Past Surgical History:  Procedure Laterality Date  . ABDOMINAL HYSTERECTOMY    . ABLATION    . APPENDECTOMY    . BREAST SURGERY    . CARPAL TUNNEL RELEASE    . FOOT SURGERY    . TUBAL LIGATION     Allergies  Allergen Reactions  . Celexa  [Citalopram Hydrobromide]     Other reaction(s): Chest Pain  . Penicillins Anaphylaxis and Rash  . Bupropion Anxiety, Nausea Only and Palpitations  . Marcaine  [Bupivacaine Hcl]     Other reaction(s): Other blisters  . Codeine Hives  . Lidocaine     Pt stated "Allergic to lidocaine injections in mouth, causes blisters in mouth"   . Hydrocodone-Acetaminophen Rash   No current facility-administered medications on file prior to encounter.   Current Outpatient Medications on File Prior to Encounter  Medication Sig Dispense Refill  . ibuprofen (ADVIL) 800 MG tablet Take 1 tablet (800 mg total) by mouth 3 (three) times daily. 90 tablet 1  . Omega-3 Fatty Acids (FISH OIL) 1000 MG CAPS Take by mouth.    . [DISCONTINUED] sertraline (ZOLOFT) 50 MG tablet Take by mouth.    . [DISCONTINUED] zolpidem (AMBIEN) 5 MG tablet Take 1 tablet (5 mg total) by mouth at bedtime as needed for sleep. 15 tablet 1   Social History   Socioeconomic History  . Marital status: Married    Spouse name: Not on file  . Number of children: Not on file  . Years  of education: Not on file  . Highest education level: Not on file  Occupational History  . Not on file  Tobacco Use  . Smoking status: Former Games developer  . Smokeless tobacco: Never Used  Substance and Sexual Activity  . Alcohol use: Never  . Drug use: Never  . Sexual activity: Not on file  Other Topics Concern  . Not on file  Social History Narrative  . Not on file   Social Determinants of Health   Financial Resource Strain:   . Difficulty of Paying Living Expenses:   Food Insecurity:   . Worried About Programme researcher, broadcasting/film/video in the Last Year:   . Barista in the Last Year:   Transportation Needs:   . Freight forwarder (Medical):   Marland Kitchen Lack of Transportation (Non-Medical):   Physical Activity:   . Days of Exercise per Week:   . Minutes of Exercise per Session:   Stress:   . Feeling of Stress :   Social Connections:   . Frequency of Communication with Friends and Family:   . Frequency of Social Gatherings with Friends and Family:   . Attends Religious Services:   . Active Member of Clubs or Organizations:   . Attends Banker Meetings:   Marland Kitchen Marital Status:  Intimate Partner Violence:   . Fear of Current or Ex-Partner:   . Emotionally Abused:   Marland Kitchen Physically Abused:   . Sexually Abused:    Family History  Problem Relation Age of Onset  . Cancer Mother   . Heart disease Father     OBJECTIVE:  Vitals:   07/10/19 1619 07/10/19 1620  BP: 102/70   Pulse: 99   Resp: 18   Temp: 98 F (36.7 C)   TempSrc: Oral   SpO2: 96%   Weight:  170 lb (77.1 kg)  Height:  5\' 2"  (1.575 m)    General appearance: alert; no distress HENT: normocephalic; atraumatic; EACs clear, TMs pearly gray; nares patent; dentition: poor; dental caries with partial tooth avulsion over right upper gums without areas of fluctuance Neck: supple without LAD Lungs: normal respirations; CTAB CV: RRR Skin: warm and dry Psychological: alert and cooperative; normal mood and  affect  ASSESSMENT & PLAN:  1. Pain, dental   2. Dental infection   3. Tooth avulsion, initial encounter     Meds ordered this encounter  Medications  . meloxicam (MOBIC) 7.5 MG tablet    Sig: Take 1 tablet (7.5 mg total) by mouth daily.    Dispense:  30 tablet    Refill:  0    Order Specific Question:   Supervising Provider    Answer:   Raylene Everts [[id number]]  . chlorhexidine (PERIDEX) 0.12 % solution    Sig: Use as directed 15 mLs in the mouth or throat 2 (two) times daily.    Dispense:  473 mL    Refill:  0    Order Specific Question:   Supervising Provider    Answer:   Raylene Everts [[id number]]  . clindamycin (CLEOCIN) 75 MG/5ML solution    Sig: Take 20 mLs (300 mg total) by mouth 4 (four) times daily for 10 days.    Dispense:  800 mL    Refill:  0    Order Specific Question:   Supervising Provider    Answer:   Raylene Everts [[id number]]  . ketorolac (TORADOL) injection 60 mg   Toradol shot given in office Mobic prescribed.  Take as directed for pain control Peridex mouthwash prescribed.  Use as directed for pain control Cleocin prescribed.  Use as directed and to completion Recommend soft diet until evaluated by dentist Maintain oral hygiene care Follow up with dentist as soon as possible for further evaluation and treatment  Return or go to the ED if you have any new or worsening symptoms such as fever, chills, difficulty swallowing, painful swallowing, oral or neck swelling, nausea, vomiting, chest pain, SOB, etc...  Reviewed expectations re: course of current medical issues. Questions answered. Outlined signs and symptoms indicating need for more acute intervention. Patient verbalized understanding. After Visit Summary given.   Lestine Box, PA-C 07/10/19 1713

## 2019-07-10 NOTE — Discharge Instructions (Signed)
Toradol shot given in office Mobic prescribed.  Take as directed for pain control Peridex mouthwash prescribed.  Use as directed for pain control Cleocin prescribed.  Use as directed and to completion Recommend soft diet until evaluated by dentist Maintain oral hygiene care Follow up with dentist as soon as possible for further evaluation and treatment  Return or go to the ED if you have any new or worsening symptoms such as fever, chills, difficulty swallowing, painful swallowing, oral or neck swelling, nausea, vomiting, chest pain, SOB, etc..Marland Kitchen

## 2019-07-13 NOTE — Progress Notes (Signed)
   Subjective: Patient presents today for evaluation of pain to the medial border of the right great toe that has been ongoing for the past few years. Patient is concerned for possible ingrown nail. Wearing shoes and applying pressure to the toe increases the pain. She has been "digging it out" herself for treatment with temporary relief. Patient presents today for further treatment and evaluation.  No past medical history on file.  Objective:  General: Well developed, nourished, in no acute distress, alert and oriented x3   Dermatology: Skin is warm, dry and supple bilateral. Medial border right hallux appears to be erythematous with evidence of an ingrowing nail. Pain on palpation noted to the border of the nail fold. The remaining nails appear unremarkable at this time. There are no open sores, lesions.  Vascular: Dorsalis Pedis artery and Posterior Tibial artery pedal pulses palpable. No lower extremity edema noted.   Neruologic: Grossly intact via light touch bilateral.  Musculoskeletal: Muscular strength within normal limits in all groups bilateral. Normal range of motion noted to all pedal and ankle joints.   Assesement: #1 Paronychia with ingrowing nail medial border right hallux  #2 Pain in toe #3 Incurvated nail #4 h/o EPF bilateral   Plan of Care:  1. Patient evaluated.  2. Discussed treatment alternatives and plan of care. Explained nail avulsion procedure and post procedure course to patient. 3. Patient opted for permanent partial nail avulsion of the medial border right great toe.  4. Prior to procedure, local anesthesia infiltration utilized using 3 ml of a 50:50 mixture of 2% plain lidocaine and 0.5% plain marcaine in a normal hallux block fashion and a betadine prep performed.  5. Partial permanent nail avulsion with chemical matrixectomy performed using 3x30sec applications of phenol followed by alcohol flush.  6. Light dressing applied. 7. Prescription for Gentamicin  cream provided to patient to use daily with a bandage.  8. Return to clinic in 2 weeks.  Felecia Shelling, DPM [facility]  Dr. Felecia Shelling, DPM    [address]                                        [place], [place] [address]                Office [phone]  Fax [phone]

## 2019-07-23 ENCOUNTER — Ambulatory Visit: Payer: Medicare Other | Admitting: Podiatry

## 2019-08-12 ENCOUNTER — Ambulatory Visit
Admission: EM | Admit: 2019-08-12 | Discharge: 2019-08-12 | Disposition: A | Discharge status: Home / Self Care | Payer: Medicare Other | Attending: Emergency Medicine | Admitting: Emergency Medicine

## 2019-08-12 ENCOUNTER — Other Ambulatory Visit: Payer: Self-pay

## 2019-08-12 DIAGNOSIS — M7711 Lateral epicondylitis, right elbow: Secondary | ICD-10-CM | POA: Diagnosis not present

## 2019-08-12 DIAGNOSIS — M25521 Pain in right elbow: Secondary | ICD-10-CM

## 2019-08-12 MED ORDER — PREDNISONE 20 MG PO TABS: 20.0000 mg | ORAL_TABLET | Freq: Two times a day (BID) | ORAL | 0 refills | Status: AC

## 2019-08-12 NOTE — ED Provider Notes (Signed)
[facility]   [id number] 08/12/19 Arrival Time: 1702  CC: RT elbow pain  SUBJECTIVE: History from: patient. Melenie Weightman is a 49 y.o. female complains of RT elbow pain x 1 week.  Denies a precipitating event or specific injury.  Does admit to bowling.  Localizes the pain to the outside of elbow.  Describes the pain as intermittent and achy in character.  Has tried OTC topical cream without relief.  Symptoms are made worse with ROM about the elbow.  Complains of numbness/ tingling in RT hand.  Denies fever, chills, erythema, ecchymosis, effusion, weakness.    ROS: As per HPI.  All other pertinent ROS negative.     History reviewed. No pertinent past medical history. Past Surgical History:  Procedure Laterality Date  . ABDOMINAL HYSTERECTOMY    . ABLATION    . APPENDECTOMY    . BREAST SURGERY    . CARPAL TUNNEL RELEASE    . FOOT SURGERY    . TUBAL LIGATION     Allergies  Allergen Reactions  . Celexa  [Citalopram Hydrobromide]     Other reaction(s): Chest Pain  . Penicillins Anaphylaxis and Rash  . Bupropion Anxiety, Nausea Only and Palpitations  . Marcaine  [Bupivacaine Hcl]     Other reaction(s): Other blisters  . Codeine Hives  . Lidocaine     Pt stated "Allergic to lidocaine injections in mouth, causes blisters in mouth"   . Hydrocodone-Acetaminophen Rash   No current facility-administered medications on file prior to encounter.   Current Outpatient Medications on File Prior to Encounter  Medication Sig Dispense Refill  . chlorhexidine (PERIDEX) 0.12 % solution Use as directed 15 mLs in the mouth or throat 2 (two) times daily. 473 mL 0  . [DISCONTINUED] sertraline (ZOLOFT) 50 MG tablet Take by mouth.    . [DISCONTINUED] zolpidem (AMBIEN) 5 MG tablet Take 1 tablet (5 mg total) by mouth at bedtime as needed for sleep. 15 tablet 1   Social History   Socioeconomic History  . Marital status: Married    Spouse name: Not on file  . Number of children: Not on file    . Years of education: Not on file  . Highest education level: Not on file  Occupational History  . Not on file  Tobacco Use  . Smoking status: Former Research scientist (life sciences)  . Smokeless tobacco: Never Used  Substance and Sexual Activity  . Alcohol use: Never  . Drug use: Never  . Sexual activity: Not on file  Other Topics Concern  . Not on file  Social History Narrative  . Not on file   Social Determinants of Health   Financial Resource Strain:   . Difficulty of Paying Living Expenses:   Food Insecurity:   . Worried About Charity fundraiser in the Last Year:   . Arboriculturist in the Last Year:   Transportation Needs:   . Film/video editor (Medical):   Marland Kitchen Lack of Transportation (Non-Medical):   Physical Activity:   . Days of Exercise per Week:   . Minutes of Exercise per Session:   Stress:   . Feeling of Stress :   Social Connections:   . Frequency of Communication with Friends and Family:   . Frequency of Social Gatherings with Friends and Family:   . Attends Religious Services:   . Active Member of Clubs or Organizations:   . Attends Archivist Meetings:   Marland Kitchen Marital Status:   Intimate Partner Violence:   .  Fear of Current or Ex-Partner:   . Emotionally Abused:   Marland Kitchen Physically Abused:   . Sexually Abused:    Family History  Problem Relation Age of Onset  . Cancer Mother   . Heart disease Father     OBJECTIVE:  Vitals:   08/12/19 1708  BP: 123/77  Pulse: 91  Resp: 18  Temp: 98.5 F (36.9 C)  SpO2: 96%    General appearance: ALERT; in no acute distress.  Head: NCAT Lungs: Normal respiratory effort CV: Radial pulse 2+ . Cap refill < 2 seconds Musculoskeletal: RT elbow Inspection: Skin warm, dry, clear and intact without obvious erythema, effusion, or ecchymosis.  Palpation: TTP over lateral epicondyle ROM: FROM active and passive Strength: 5/5 elbow flexion, 5/5 elbow extension, 5/5 grip strength Skin: warm and dry Neurologic: Ambulates without  difficulty; Sensation intact about the upper extremities Psychological: alert and cooperative; normal mood and affect  ASSESSMENT & PLAN:  1. Right lateral epicondylitis   2. Right elbow pain     Meds ordered this encounter  Medications  . predniSONE (DELTASONE) 20 MG tablet    Sig: Take 1 tablet (20 mg total) by mouth 2 (two) times daily with a meal for 5 days.    Dispense:  10 tablet    Refill:  0    Order Specific Question:   Supervising Provider    Answer:   Eustace Moore [[id number]]    Continue conservative management of rest, ice, and gentle stretches/ massage Avoid painful activities or repetitive elbow activities; if bowling, try using a lighter ball or resting for the next few weeks You may want to invest in a tennis elbow brace that you can buy at the drug store Wear brace while bowling in the future Prednisone prescribed.  Take as directed and to completion Follow up with orthopedist if symptoms persists Return or go to the ER if you have any new or worsening symptoms (fever, chills, bruising, swelling, deformity, worsening symptoms despite treatment, etc...)   Reviewed expectations re: course of current medical issues. Questions answered. Outlined signs and symptoms indicating need for more acute intervention. Patient verbalized understanding. After Visit Summary given.    Rennis Harding, PA-C 08/12/19 1728

## 2019-08-12 NOTE — Discharge Instructions (Signed)
Continue conservative management of rest, ice, and gentle stretches/ massage Avoid painful activities or repetitive elbow activities; if bowling, try using a lighter ball or resting for the next few weeks You may want to invest in a tennis elbow brace that you can buy at the drug store Wear brace while bowling in the future Prednisone prescribed.  Take as directed and to completion Follow up with orthopedist if symptoms persists Return or go to the ER if you have any new or worsening symptoms (fever, chills, bruising, swelling, deformity, worsening symptoms despite treatment, etc...)

## 2019-08-12 NOTE — ED Triage Notes (Signed)
Pt presents with complaints of right elbow pain that started last week. Denies any injuries. Denies relief with otc topical cream. Patient states the pain is in her elbow and goes down into her hand.

## 2019-09-23 ENCOUNTER — Other Ambulatory Visit: Payer: Self-pay

## 2019-10-13 ENCOUNTER — Other Ambulatory Visit: Payer: Self-pay

## 2019-10-13 ENCOUNTER — Ambulatory Visit
Admission: EM | Admit: 2019-10-13 | Discharge: 2019-10-13 | Disposition: A | Discharge status: Home / Self Care | Payer: Medicare Other

## 2019-10-13 ENCOUNTER — Encounter: Payer: Self-pay | Admitting: Emergency Medicine

## 2019-10-13 DIAGNOSIS — H1013 Acute atopic conjunctivitis, bilateral: Secondary | ICD-10-CM

## 2019-10-13 DIAGNOSIS — J309 Allergic rhinitis, unspecified: Secondary | ICD-10-CM

## 2019-10-13 MED ORDER — OLOPATADINE HCL 0.2 % OP SOLN: 1.0000 [drp] | Freq: Every day | OPHTHALMIC | 0 refills | Status: DC

## 2019-10-13 MED ORDER — PREDNISONE 10 MG PO TABS: 20.0000 mg | ORAL_TABLET | Freq: Every day | ORAL | 0 refills | Status: DC

## 2019-10-13 MED ORDER — ALBUTEROL SULFATE HFA 108 (90 BASE) MCG/ACT IN AERS: 1.0000 | INHALATION_SPRAY | Freq: Four times a day (QID) | RESPIRATORY_TRACT | 0 refills | Status: AC | PRN

## 2019-10-13 NOTE — ED Provider Notes (Signed)
[facility]   [id number] 10/13/19 Arrival Time: 1907  Chief Complaint  Patient presents with  . Eye Problem    SUBJECTIVE:  Sandy Phillips is a 49 y.o. female who presents with complaint of eye itchiness and watery drainage that started yesterday.  Denies a precipitating event, trauma, or close contacts with similar symptoms.  Has tried OTC eye drops without relief.  Nothing made her symptoms worse.  Denies similar symptoms in the past.  Denies fever, chills, nausea, vomiting, eye pain, painful eye movements, halos, discharge, itching, vision changes, double vision, FB sensation, periorbital erythema.     Denies contact lens use.    She is also complaining of sinus pressure and sinus pain for the past few days.  Denies sick exposure.  Has tried OTC Zyrtec without relief.  Symptoms made worse by lying down.  Denies similar symptoms in the past.  Denies chills, fever, nausea, vomiting, diarrhea.  ROS: As per HPI.  All other pertinent ROS negative.     History reviewed. No pertinent past medical history. Past Surgical History:  Procedure Laterality Date  . ABDOMINAL HYSTERECTOMY    . ABLATION    . APPENDECTOMY    . BREAST SURGERY    . CARPAL TUNNEL RELEASE    . FOOT SURGERY    . TUBAL LIGATION     Allergies  Allergen Reactions  . Celexa  [Citalopram Hydrobromide]     Other reaction(s): Chest Pain  . Penicillins Anaphylaxis and Rash  . Bupropion Anxiety, Nausea Only and Palpitations  . Marcaine  [Bupivacaine Hcl]     Other reaction(s): Other blisters  . Codeine Hives  . Lidocaine     Pt stated "Allergic to lidocaine injections in mouth, causes blisters in mouth"   . Hydrocodone-Acetaminophen Rash   No current facility-administered medications on file prior to encounter.   Current Outpatient Medications on File Prior to Encounter  Medication Sig Dispense Refill  . ibuprofen (ADVIL) 800 MG tablet Take 800 mg by mouth every 8 (eight) hours as needed.    .  chlorhexidine (PERIDEX) 0.12 % solution Use as directed 15 mLs in the mouth or throat 2 (two) times daily. 473 mL 0  . [DISCONTINUED] sertraline (ZOLOFT) 50 MG tablet Take by mouth.    . [DISCONTINUED] zolpidem (AMBIEN) 5 MG tablet Take 1 tablet (5 mg total) by mouth at bedtime as needed for sleep. 15 tablet 1   Social History   Socioeconomic History  . Marital status: Married    Spouse name: Not on file  . Number of children: Not on file  . Years of education: Not on file  . Highest education level: Not on file  Occupational History  . Not on file  Tobacco Use  . Smoking status: Former Games developer  . Smokeless tobacco: Never Used  Vaping Use  . Vaping Use: Never used  Substance and Sexual Activity  . Alcohol use: Never  . Drug use: Never  . Sexual activity: Not on file  Other Topics Concern  . Not on file  Social History Narrative  . Not on file   Social Determinants of Health   Financial Resource Strain:   . Difficulty of Paying Living Expenses:   Food Insecurity:   . Worried About Programme researcher, broadcasting/film/video in the Last Year:   . Barista in the Last Year:   Transportation Needs:   . Freight forwarder (Medical):   Marland Kitchen Lack of Transportation (Non-Medical):   Physical Activity:   .  Days of Exercise per Week:   . Minutes of Exercise per Session:   Stress:   . Feeling of Stress :   Social Connections:   . Frequency of Communication with Friends and Family:   . Frequency of Social Gatherings with Friends and Family:   . Attends Religious Services:   . Active Member of Clubs or Organizations:   . Attends Banker Meetings:   Marland Kitchen Marital Status:   Intimate Partner Violence:   . Fear of Current or Ex-Partner:   . Emotionally Abused:   Marland Kitchen Physically Abused:   . Sexually Abused:    Family History  Problem Relation Age of Onset  . Cancer Mother   . Heart disease Father     OBJECTIVE:    Visual Acuity  Right Eye Distance:   Left Eye Distance:     Bilateral Distance:    Right Eye Near:   Left Eye Near:    Bilateral Near:      Vitals:   10/13/19 1922 10/13/19 1924  BP:  125/84  Pulse:  95  Resp:  18  Temp:  98 F (36.7 C)  TempSrc:  Oral  SpO2:  95%  Weight: 169 lb 12.1 oz (77 kg)   Height: 5\' 2"  (1.575 m)     Physical Exam Vitals and nursing note reviewed.  Constitutional:      General: She is not in acute distress.    Appearance: Normal appearance. She is normal weight. She is not ill-appearing, toxic-appearing or diaphoretic.  HENT:     Head: Normocephalic.     Right Ear: Ear canal and external ear normal. A middle ear effusion is present. There is no impacted cerumen.     Left Ear: Ear canal and external ear normal. A middle ear effusion is present. There is no impacted cerumen.     Nose: No congestion.     Right Sinus: Maxillary sinus tenderness present.     Left Sinus: Maxillary sinus tenderness present.  Eyes:     General: Lids are normal. Vision grossly intact. Gaze aligned appropriately.        Right eye: Discharge present.        Left eye: Discharge present. Cardiovascular:     Rate and Rhythm: Normal rate and regular rhythm.     Pulses: Normal pulses.     Heart sounds: Normal heart sounds. No murmur heard.  No friction rub. No gallop.   Pulmonary:     Effort: Pulmonary effort is normal. No respiratory distress.     Breath sounds: Normal breath sounds. No stridor. No wheezing or rales.  Chest:     Chest wall: No tenderness.  Neurological:     Mental Status: She is alert.      ASSESSMENT & PLAN:  1. Allergic conjunctivitis of both eyes   2. Allergic sinusitis     Meds ordered this encounter  Medications  . Olopatadine HCl 0.2 % SOLN    Sig: Apply 1 drop to eye daily.    Dispense:  2.5 mL    Refill:  0  . predniSONE (DELTASONE) 10 MG tablet    Sig: Take 2 tablets (20 mg total) by mouth daily.    Dispense:  15 tablet    Refill:  0     Discharge instruction Continue Zyrtec and  Flonase as prescribed Take olopatadine eyedrop as prescribed Take prednisone as prescribed ProAir was refilled Follow-up with PCP Return to to ED for worsening symptomsc. Marland Kitchen  Reviewed expectations re: course of current medical issues. Questions answered. Outlined signs and symptoms indicating need for more acute intervention. Patient verbalized understanding. After Visit Summary given.   Emerson Monte, FNP 10/13/19 1952

## 2019-10-13 NOTE — Discharge Instructions (Addendum)
Continue Zyrtec and Flonase as prescribed Take olopatadine eyedrop as prescribed Take prednisone as prescribed ProAir was refilled Follow-up with PCP Return to to ED for worsening symptoms

## 2019-10-13 NOTE — ED Triage Notes (Signed)
Itching and watering to LT eye since yesterday.  Pt also c/o headaches at across eyebrow area.

## 2019-12-08 ENCOUNTER — Other Ambulatory Visit: Payer: Self-pay

## 2019-12-08 ENCOUNTER — Ambulatory Visit (INDEPENDENT_AMBULATORY_CARE_PROVIDER_SITE_OTHER): Payer: Medicare Other | Admitting: Podiatry

## 2019-12-08 DIAGNOSIS — M722 Plantar fascial fibromatosis: Secondary | ICD-10-CM

## 2019-12-08 MED ORDER — METHYLPREDNISOLONE 4 MG PO TBPK: ORAL_TABLET | ORAL | 0 refills | Status: DC

## 2019-12-08 MED ORDER — MELOXICAM 15 MG PO TABS: 15.0000 mg | ORAL_TABLET | Freq: Every day | ORAL | 1 refills | Status: DC

## 2019-12-08 NOTE — Progress Notes (Signed)
   Subjective: 49 y.o. female presenting today for evaluation of right heel pain.  Patient does have history of bilateral EPF surgeries.  Right EPF DOS: 01/09/2019.  Left EPF DOS: October 2018.  Patient states that the right heel has been painful for a few months now.  She has trouble standing for long periods of time.  She experiences burning sensation.  She presents for further treatment and evaluation.  She has not done anything for treatment other than good supportive shoes with arch supports   No past medical history on file.   Objective: Physical Exam General: The patient is alert and oriented x3 in no acute distress.  Dermatology: Skin is warm, dry and supple bilateral lower extremities. Negative for open lesions or macerations bilateral.   Vascular: Dorsalis Pedis and Posterior Tibial pulses palpable bilateral.  Capillary fill time is immediate to all digits.  Neurological: Epicritic and protective threshold intact bilateral.   Musculoskeletal: Tenderness to palpation to the plantar aspect of the right heel along the plantar fascia. All other joints range of motion within normal limits bilateral. Strength 5/5 in all groups bilateral.   Radiographic exam: Normal osseous mineralization. Joint spaces preserved. No fracture/dislocation/boney destruction. No other soft tissue abnormalities or radiopaque foreign bodies.   Assessment: 1. Plantar fasciitis right 2.  H/o EPF right: 01/09/2019 3.  H/o EPF left: October 2018  Plan of Care:  1. Patient evaluated. Xrays reviewed.   2.  Patient declined injection today 3. Rx for Medrol Dose Pack placed 4. Rx for Meloxicam ordered for patient. 5.  Continue good supportive shoes with arch supports 6. Instructed patient regarding therapies and modalities at home to alleviate symptoms.  7. Return to clinic in 4 weeks.     Felecia Shelling, DPM [facility]  Dr. Felecia Shelling, DPM    [address] [place], [place] [address]                Office [phone]  Fax [phone]

## 2020-01-12 ENCOUNTER — Other Ambulatory Visit: Payer: Self-pay

## 2020-01-12 ENCOUNTER — Ambulatory Visit (INDEPENDENT_AMBULATORY_CARE_PROVIDER_SITE_OTHER): Payer: Medicare Other | Admitting: Podiatry

## 2020-01-12 DIAGNOSIS — M722 Plantar fascial fibromatosis: Secondary | ICD-10-CM | POA: Diagnosis not present

## 2020-01-12 DIAGNOSIS — M659 Synovitis and tenosynovitis, unspecified: Secondary | ICD-10-CM | POA: Diagnosis not present

## 2020-01-12 NOTE — Progress Notes (Signed)
   Subjective: 49 y.o. female presenting today for evaluation of right heel pain.  Patient does have history of bilateral EPF surgeries.  Right EPF DOS: 01/09/2019.  Left EPF DOS: October 2018.  Overall the patient states that she is doing much better.  She states that the prednisone pack helped significantly.  She is taking meloxicam as needed.  She does state that she does have some intermittent ankle pain on occasion to the right ankle.  She presents for further treatment and evaluation   No past medical history on file.   Objective: Physical Exam General: The patient is alert and oriented x3 in no acute distress.  Dermatology: Skin is warm, dry and supple bilateral lower extremities. Negative for open lesions or macerations bilateral.   Vascular: Dorsalis Pedis and Posterior Tibial pulses palpable bilateral.  Capillary fill time is immediate to all digits.  Neurological: Epicritic and protective threshold intact bilateral.   Musculoskeletal: Tenderness to palpation to the plantar aspect of the right heel along the plantar fascia. All other joints range of motion within normal limits bilateral.  There is also tenderness to palpation along the anterior medial and lateral aspect of the right ankle joint.  Strength 5/5 in all groups bilateral.   Assessment: 1. Plantar fasciitis right 2.  H/o EPF right: 01/09/2019 3.  H/o EPF left: October 2018 4.  Ankle capsulitis/DJD right  Plan of Care:  1. Patient evaluated.  2.  Patient declined injection today 3.  Continue meloxicam as needed  4.  Continue good supportive shoes with arch supports 6. Instructed patient regarding therapies and modalities at home to alleviate symptoms.  7.  Return to clinic as needed   Felecia Shelling, DPM [facility]  Dr. Felecia Shelling, DPM    [address] [place], [place] [address]                Office [phone]  Fax [phone]

## 2020-02-29 ENCOUNTER — Other Ambulatory Visit: Payer: Self-pay | Admitting: Podiatry

## 2020-03-01 ENCOUNTER — Encounter: Payer: Self-pay | Admitting: Emergency Medicine

## 2020-03-01 ENCOUNTER — Ambulatory Visit
Admission: EM | Admit: 2020-03-01 | Discharge: 2020-03-01 | Disposition: A | Discharge status: Home / Self Care | Payer: Medicare Other | Attending: Emergency Medicine | Admitting: Emergency Medicine

## 2020-03-01 DIAGNOSIS — B9789 Other viral agents as the cause of diseases classified elsewhere: Secondary | ICD-10-CM

## 2020-03-01 DIAGNOSIS — J019 Acute sinusitis, unspecified: Secondary | ICD-10-CM

## 2020-03-01 MED ORDER — FLUTICASONE PROPIONATE 50 MCG/ACT NA SUSP: 1.0000 | Freq: Every day | NASAL | 0 refills | Status: AC

## 2020-03-01 MED ORDER — PREDNISONE 10 MG PO TABS: 20.0000 mg | ORAL_TABLET | Freq: Every day | ORAL | 0 refills | Status: DC

## 2020-03-01 NOTE — Discharge Instructions (Addendum)
Rest and push fluids Prednisone was prescribed Flonase was prescribed  May use OTC sinus Sudafed for symptomatic relief continue with OTC ibuprofen/tylenol as needed for pain Follow up with PCP or [facility] if symptoms persists Return or go to the ED if you have any new or worsening symptoms such as fever, chills, worsening sinus pain/pressure, cough, sore throat, chest pain, shortness of breath, abdominal pain, changes in bowel or bladder habits, etc

## 2020-03-01 NOTE — ED Triage Notes (Signed)
Sinus congestion and facial tenderness x 1 week

## 2020-03-01 NOTE — ED Provider Notes (Signed)
[facility]   [id number] 03/01/20 Arrival Time: 1629   Chief Complaint  Patient presents with   Facial Pain     SUBJECTIVE: History from: patient.  Inez Yearwood is a 49 y.o. female who presented to the urgent care with a complaint of sinus pressure, sinus pain and nasal congestion for the past 1 week.  Reported clear nasal discharge..  Denies sick exposure to COVID, flu or strep.  Denies recent travel.  Has tried OTC medication without relief.  Denies aggravating factor.  Denies previous symptoms in the past.   Denies fever, chills, fatigue,  rhinorrhea, sore throat, SOB, wheezing, chest pain, nausea, changes in bowel or bladder habits.     ROS: As per HPI.  All other pertinent ROS negative.      History reviewed. No pertinent past medical history. Past Surgical History:  Procedure Laterality Date   ABDOMINAL HYSTERECTOMY     ABLATION     APPENDECTOMY     BREAST SURGERY     CARPAL TUNNEL RELEASE     FOOT SURGERY     TUBAL LIGATION     Allergies  Allergen Reactions   Celexa  [Citalopram Hydrobromide]     Other reaction(s): Chest Pain   Penicillins Anaphylaxis and Rash   Bupropion Anxiety, Nausea Only and Palpitations   Marcaine  [Bupivacaine Hcl]     Other reaction(s): Other blisters   Codeine Hives   Lidocaine     Pt stated "Allergic to lidocaine injections in mouth, causes blisters in mouth"    Hydrocodone-Acetaminophen Rash   No current facility-administered medications on file prior to encounter.   Current Outpatient Medications on File Prior to Encounter  Medication Sig Dispense Refill   albuterol (VENTOLIN HFA) 108 (90 Base) MCG/ACT inhaler Inhale 1-2 puffs into the lungs every 6 (six) hours as needed for wheezing or shortness of breath. 18 g 0   buPROPion (WELLBUTRIN XL) 150 MG 24 hr tablet TAKE 1 TABLET(150 MG) BY MOUTH DAILY     buPROPion (WELLBUTRIN XL) 150 MG 24 hr tablet Take 150 mg by mouth at bedtime.     cephALEXin  (KEFLEX) 500 MG capsule Take 500 mg by mouth 4 (four) times daily.     chlorhexidine (PERIDEX) 0.12 % solution Use as directed 15 mLs in the mouth or throat 2 (two) times daily. 473 mL 0   estradiol (ESTRACE) 0.1 MG/GM vaginal cream 1 gram of cream vaginally every night for 2 weeks then 1 gram of cream vaginally twice a week thereafter.     gentamicin cream (GARAMYCIN) 0.1 % APPLY EXTERNALLY TO THE AFFECTED AREA TWICE DAILY     hydrOXYzine (ATARAX/VISTARIL) 25 MG tablet Take 25 mg by mouth 3 (three) times daily.     ibuprofen (ADVIL) 800 MG tablet Take 800 mg by mouth every 8 (eight) hours as needed.     meloxicam (MOBIC) 15 MG tablet TAKE 1 TABLET(15 MG) BY MOUTH DAILY 30 tablet 1   methylPREDNISolone (MEDROL DOSEPAK) 4 MG TBPK tablet 6 day dose pack - take as directed 21 tablet 0   Olopatadine HCl 0.2 % SOLN Apply 1 drop to eye daily. 2.5 mL 0   [DISCONTINUED] sertraline (ZOLOFT) 50 MG tablet Take by mouth.     [DISCONTINUED] zolpidem (AMBIEN) 5 MG tablet Take 1 tablet (5 mg total) by mouth at bedtime as needed for sleep. 15 tablet 1   Social History   Socioeconomic History   Marital status: Married    Spouse name: Not  on file   Number of children: Not on file   Years of education: Not on file   Highest education level: Not on file  Occupational History   Not on file  Tobacco Use   Smoking status: Former Smoker   Smokeless tobacco: Never Used  Building services engineer Use: Never used  Substance and Sexual Activity   Alcohol use: Never   Drug use: Never   Sexual activity: Not on file  Other Topics Concern   Not on file  Social History Narrative   Not on file   Social Determinants of Health   Financial Resource Strain:    Difficulty of Paying Living Expenses: Not on file  Food Insecurity:    Worried About Running Out of Food in the Last Year: Not on file   Ran Out of Food in the Last Year: Not on file  Transportation Needs:    Lack of Transportation  (Medical): Not on file   Lack of Transportation (Non-Medical): Not on file  Physical Activity:    Days of Exercise per Week: Not on file   Minutes of Exercise per Session: Not on file  Stress:    Feeling of Stress : Not on file  Social Connections:    Frequency of Communication with Friends and Family: Not on file   Frequency of Social Gatherings with Friends and Family: Not on file   Attends Religious Services: Not on file   Active Member of Clubs or Organizations: Not on file   Attends Banker Meetings: Not on file   Marital Status: Not on file  Intimate Partner Violence:    Fear of Current or Ex-Partner: Not on file   Emotionally Abused: Not on file   Physically Abused: Not on file   Sexually Abused: Not on file   Family History  Problem Relation Age of Onset   Cancer Mother    Heart disease Father     OBJECTIVE:  Vitals:   03/01/20 1644  BP: 105/74  Pulse: 95  Resp: 16  Temp: 97.9 F (36.6 C)  SpO2: 98%     Physical Exam Vitals and nursing note reviewed.  Constitutional:      General: She is not in acute distress.    Appearance: Normal appearance. She is normal weight. She is not ill-appearing, toxic-appearing or diaphoretic.  HENT:     Head: Normocephalic.     Right Ear: Tympanic membrane, ear canal and external ear normal. There is no impacted cerumen.     Left Ear: Tympanic membrane, ear canal and external ear normal. There is no impacted cerumen.     Nose: Congestion present. No rhinorrhea.     Right Sinus: Maxillary sinus tenderness present.     Left Sinus: Maxillary sinus tenderness present.     Mouth/Throat:     Mouth: Mucous membranes are moist.     Pharynx: No oropharyngeal exudate or posterior oropharyngeal erythema.  Cardiovascular:     Rate and Rhythm: Normal rate and regular rhythm.     Pulses: Normal pulses.     Heart sounds: Normal heart sounds. No murmur heard.  No friction rub. No gallop.   Pulmonary:      Effort: Pulmonary effort is normal. No respiratory distress.     Breath sounds: Normal breath sounds. No stridor. No wheezing, rhonchi or rales.  Chest:     Chest wall: No tenderness.  Neurological:     Mental Status: She is alert and oriented to person,  place, and time.     LABS:   No results found for this or any previous visit (from the past 24 hour(s)).   ASSESSMENT & PLAN:  1. Acute viral sinusitis     Meds ordered this encounter  Medications   predniSONE (DELTASONE) 10 MG tablet    Sig: Take 2 tablets (20 mg total) by mouth daily.    Dispense:  15 tablet    Refill:  0   fluticasone (FLONASE) 50 MCG/ACT nasal spray    Sig: Place 1 spray into both nostrils daily for 14 days.    Dispense:  16 g    Refill:  0    Discharge instructions  Rest and push fluids Prednisone was prescribed Flonase was prescribed  Use OTC sinus Sudafed for symptomatic relief continue with OTC ibuprofen/tylenol as needed for pain Follow up with PCP or [facility] if symptoms persists Return or go to the ED if you have any new or worsening symptoms such as fever, chills, worsening sinus pain/pressure, cough, sore throat, chest pain, shortness of breath, abdominal pain, changes in bowel or bladder habits, etc... Reviewed expectations re: course of current medical issues. Questions answered. Outlined signs and symptoms indicating need for more acute intervention. Patient verbalized understanding. After Visit Summary given.         Durward Parcel, FNP 03/01/20 1734

## 2020-03-17 ENCOUNTER — Encounter: Payer: Self-pay | Admitting: Podiatry

## 2020-03-17 ENCOUNTER — Ambulatory Visit (INDEPENDENT_AMBULATORY_CARE_PROVIDER_SITE_OTHER): Payer: Medicare Other | Admitting: Podiatry

## 2020-03-17 ENCOUNTER — Other Ambulatory Visit: Payer: Self-pay

## 2020-03-17 DIAGNOSIS — M659 Synovitis and tenosynovitis, unspecified: Secondary | ICD-10-CM | POA: Diagnosis not present

## 2020-03-17 DIAGNOSIS — M722 Plantar fascial fibromatosis: Secondary | ICD-10-CM | POA: Diagnosis not present

## 2020-03-17 MED ORDER — MELOXICAM 15 MG PO TABS: 15.0000 mg | ORAL_TABLET | Freq: Every day | ORAL | 2 refills | Status: DC

## 2020-03-17 MED ORDER — METHYLPREDNISOLONE 4 MG PO TBPK: ORAL_TABLET | ORAL | 0 refills | Status: DC

## 2020-03-17 NOTE — Progress Notes (Signed)
   Subjective: 49 y.o. female presenting today for evaluation of right heel pain.  Patient does have history of bilateral EPF surgeries.  Right EPF DOS: 01/09/2019.  Left EPF DOS: October 2018.  Overall the patient states that she is doing much better.  She continues to have some ankle pain to the medial aspect of the right ankle.  She would like to return to work and presents today for further treatment evaluation  No past medical history on file.   Objective: Physical Exam General: The patient is alert and oriented x3 in no acute distress.  Dermatology: Skin is warm, dry and supple bilateral lower extremities. Negative for open lesions or macerations bilateral.   Vascular: Dorsalis Pedis and Posterior Tibial pulses palpable bilateral.  Capillary fill time is immediate to all digits.  Neurological: Epicritic and protective threshold intact bilateral.   Musculoskeletal: Tenderness to palpation to the plantar aspect of the right heel along the plantar fascia. All other joints range of motion within normal limits bilateral.  There is also tenderness to palpation along the anterior medial and lateral aspect of the right ankle joint.  Strength 5/5 in all groups bilateral.   Assessment: 1. Plantar fasciitis right 2.  H/o EPF right: 01/09/2019 3.  H/o EPF left: October 2018 4.  Ankle capsulitis/DJD right  Plan of Care:  1. Patient evaluated.  2.  Patient declined injection today 3.  Today we will prescribe an additional Medrol Dosepak 4.  Refill prescription for meloxicam 15 mg daily to take after completion of the Dosepak 5.  Continue ankle brace or compression anklet as needed 6.  Return to clinic as needed  Felecia Shelling, DPM [facility]  Dr. Felecia Shelling, DPM    [address] [place], [place] [address]                Office [phone]  Fax [phone]

## 2020-04-08 ENCOUNTER — Telehealth: Payer: Self-pay | Admitting: Podiatry

## 2020-04-08 NOTE — Telephone Encounter (Signed)
Patient is requesting a refill on pain meds. She would like 800 mg Ibuprofen.

## 2020-04-10 ENCOUNTER — Emergency Department (HOSPITAL_COMMUNITY): Payer: Medicare Other

## 2020-04-10 ENCOUNTER — Emergency Department (HOSPITAL_COMMUNITY)
Admission: EM | Admit: 2020-04-10 | Discharge: 2020-04-10 | Disposition: A | Discharge status: Home / Self Care | Payer: Medicare Other | Attending: Emergency Medicine | Admitting: Emergency Medicine

## 2020-04-10 ENCOUNTER — Other Ambulatory Visit: Payer: Self-pay

## 2020-04-10 ENCOUNTER — Encounter (HOSPITAL_COMMUNITY): Payer: Self-pay

## 2020-04-10 DIAGNOSIS — Z5321 Procedure and treatment not carried out due to patient leaving prior to being seen by health care provider: Secondary | ICD-10-CM | POA: Diagnosis not present

## 2020-04-10 DIAGNOSIS — M25571 Pain in right ankle and joints of right foot: Secondary | ICD-10-CM | POA: Diagnosis not present

## 2020-04-10 NOTE — ED Triage Notes (Signed)
Pt presents to the ED with right ankle pain x 2 months. Pt has seen Dr Logan Bores last month and was told "It is the way she walks that is causing the pain."

## 2020-04-28 ENCOUNTER — Other Ambulatory Visit: Payer: Self-pay

## 2020-04-28 ENCOUNTER — Encounter: Payer: Self-pay | Admitting: Emergency Medicine

## 2020-04-28 ENCOUNTER — Ambulatory Visit
Admission: EM | Admit: 2020-04-28 | Discharge: 2020-04-28 | Disposition: A | Discharge status: Home / Self Care | Payer: Medicare Other | Attending: Family Medicine | Admitting: Family Medicine

## 2020-04-28 DIAGNOSIS — R202 Paresthesia of skin: Secondary | ICD-10-CM

## 2020-04-28 DIAGNOSIS — M25521 Pain in right elbow: Secondary | ICD-10-CM

## 2020-04-28 MED ORDER — DEXAMETHASONE SODIUM PHOSPHATE 10 MG/ML IJ SOLN: 10.0000 mg | Freq: Once | INTRAMUSCULAR | Status: DC

## 2020-04-28 MED ORDER — DICLOFENAC SODIUM 1 % EX GEL: 4.0000 g | Freq: Four times a day (QID) | CUTANEOUS | 0 refills | Status: DC

## 2020-04-28 MED ORDER — LIDOCAINE 4 % EX PTCH: 1.0000 | MEDICATED_PATCH | Freq: Two times a day (BID) | CUTANEOUS | 0 refills | Status: AC | PRN

## 2020-04-28 NOTE — Discharge Instructions (Signed)
I have sent in diclofenac gel for you to use four times per day as needed  I have sent in lidocaine patches for your elbow  I have placed a referral for orthopedics  Follow up with this office or with primary care if symptoms are persisting.  Follow up in the ER for high fever, trouble swallowing, trouble breathing, other concerning symptoms.

## 2020-04-28 NOTE — ED Provider Notes (Signed)
[facility]   [id number] 04/28/20 Arrival Time: 9458  PF:[id number] PAIN  SUBJECTIVE: History from: patient. Sandy Phillips is a 49 y.o. female complains of elbow pain that began about 2 months ago. No known injury. Has been on steroids in the past and this helped. Was told that she needed to see orthopedics. She has not followed up there yet. Reports the pain as shooting down her forearm from her elbow.  Denies a precipitating event or specific injury.  Localizes the pain to the ;ateral aspect of the elbow.  Has tried OTC medications without relief.  Symptoms are made worse with activity. Denies fever, chills, erythema, ecchymosis, effusion, weakness, saddle paresthesias, loss of bowel or bladder function.      ROS: As per HPI.  All other pertinent ROS negative.     History reviewed. No pertinent past medical history. Past Surgical History:  Procedure Laterality Date   ABDOMINAL HYSTERECTOMY     ABLATION     APPENDECTOMY     BREAST SURGERY     CARPAL TUNNEL RELEASE     FOOT SURGERY     TUBAL LIGATION     Allergies  Allergen Reactions   Celexa  [Citalopram Hydrobromide]     Other reaction(s): Chest Pain   Penicillins Anaphylaxis and Rash   Bupropion Anxiety, Nausea Only and Palpitations   Marcaine  [Bupivacaine Hcl]     Other reaction(s): Other blisters   Codeine Hives   Lidocaine     Pt stated "Allergic to lidocaine injections in mouth, causes blisters in mouth"    Hydrocodone-Acetaminophen Rash   No current facility-administered medications on file prior to encounter.   Current Outpatient Medications on File Prior to Encounter  Medication Sig Dispense Refill   albuterol (VENTOLIN HFA) 108 (90 Base) MCG/ACT inhaler Inhale 1-2 puffs into the lungs every 6 (six) hours as needed for wheezing or shortness of breath. 18 g 0   buPROPion (WELLBUTRIN XL) 150 MG 24 hr tablet TAKE 1 TABLET(150 MG) BY MOUTH DAILY     buPROPion (WELLBUTRIN XL) 150 MG 24 hr tablet  Take 150 mg by mouth at bedtime.     cephALEXin (KEFLEX) 500 MG capsule Take 500 mg by mouth 4 (four) times daily.     chlorhexidine (PERIDEX) 0.12 % solution Use as directed 15 mLs in the mouth or throat 2 (two) times daily. 473 mL 0   estradiol (ESTRACE) 0.1 MG/GM vaginal cream 1 gram of cream vaginally every night for 2 weeks then 1 gram of cream vaginally twice a week thereafter.     fluticasone (FLONASE) 50 MCG/ACT nasal spray Place 1 spray into both nostrils daily for 14 days. 16 g 0   gentamicin cream (GARAMYCIN) 0.1 % APPLY EXTERNALLY TO THE AFFECTED AREA TWICE DAILY     hydrOXYzine (ATARAX/VISTARIL) 25 MG tablet Take 25 mg by mouth 3 (three) times daily.     ibuprofen (ADVIL) 800 MG tablet Take 800 mg by mouth every 8 (eight) hours as needed.     Olopatadine HCl 0.2 % SOLN Apply 1 drop to eye daily. 2.5 mL 0   [DISCONTINUED] sertraline (ZOLOFT) 50 MG tablet Take by mouth.     [DISCONTINUED] zolpidem (AMBIEN) 5 MG tablet Take 1 tablet (5 mg total) by mouth at bedtime as needed for sleep. 15 tablet 1   Social History   Socioeconomic History   Marital status: Married    Spouse name: Not on file   Number of children: Not on file  Years of education: Not on file   Highest education level: Not on file  Occupational History   Not on file  Tobacco Use   Smoking status: Former Smoker   Smokeless tobacco: Never Used  Building services engineer Use: Never used  Substance and Sexual Activity   Alcohol use: Never   Drug use: Never   Sexual activity: Not on file  Other Topics Concern   Not on file  Social History Narrative   Not on file   Social Determinants of Health   Financial Resource Strain: Not on file  Food Insecurity: Not on file  Transportation Needs: Not on file  Physical Activity: Not on file  Stress: Not on file  Social Connections: Not on file  Intimate Partner Violence: Not on file   Family History  Problem Relation Age of Onset   Cancer  Mother    Heart disease Father     OBJECTIVE:  Vitals:   04/28/20 0933  BP: 117/79  Pulse: 89  Resp: 16  Temp: 97.9 F (36.6 C)  TempSrc: Oral  SpO2: 96%    General appearance: ALERT; in no acute distress.  Head: NCAT Lungs: Normal respiratory effort CV: pulses 2+ bilaterally. Cap refill < 2 seconds Musculoskeletal:  Inspection: Skin warm, dry, clear and intact without obvious erythema, effusion, or ecchymosis.  Palpation: Nontender to palpation ROM: limited ROM active and passive to L elbow Skin: warm and dry Neurologic: Ambulates without difficulty; Sensation intact about the upper/ lower extremities Psychological: alert and cooperative; normal mood and affect  DIAGNOSTIC STUDIES:  No results found.   ASSESSMENT & PLAN:  1. Right elbow pain   2. Paresthesia of arm     Meds ordered this encounter  Medications   diclofenac Sodium (VOLTAREN) 1 % GEL    Sig: Apply 4 g topically 4 (four) times daily.    Dispense:  100 g    Refill:  0    Order Specific Question:   Supervising Provider    Answer:   Merrilee Jansky [id number]   Lidocaine (HM LIDOCAINE PATCH) 4 % PTCH    Sig: Apply 1 each topically 2 (two) times daily as needed (as needed for pain).    Dispense:  30 patch    Refill:  0    Order Specific Question:   Supervising Provider    Answer:   Merrilee Jansky [[id number]]   DISCONTD: dexamethasone (DECADRON) injection 10 mg   Prescribed diclofenac gel Prescribed lidocaine patches Referral placed to orthopedics Continue conservative management of rest, ice, and gentle stretches Take ibuprofen as needed for pain relief (may cause abdominal discomfort, ulcers, and GI bleeds avoid taking with other NSAIDs) Follow up with PCP if symptoms persist Return or go to the ER if you have any new or worsening symptoms (fever, chills, chest pain, abdominal pain, changes in bowel or bladder habits, pain radiating into lower legs)   Reviewed expectations re: course of  current medical issues. Questions answered. Outlined signs and symptoms indicating need for more acute intervention. Patient verbalized understanding. After Visit Summary given.       Moshe Cipro, NP 04/28/20 1657

## 2020-04-28 NOTE — ED Triage Notes (Signed)
elbow pain x 2 months no injury has been on steroids states it helped was told she need to see an "elbow specialist"

## 2020-05-12 ENCOUNTER — Other Ambulatory Visit: Payer: Self-pay

## 2020-05-12 ENCOUNTER — Encounter: Payer: Self-pay | Admitting: Orthopedic Surgery

## 2020-05-12 ENCOUNTER — Ambulatory Visit (INDEPENDENT_AMBULATORY_CARE_PROVIDER_SITE_OTHER): Payer: Medicare Other | Admitting: Orthopedic Surgery

## 2020-05-12 VITALS — BP 124/76 | HR 96 | Ht 62.0 in

## 2020-05-12 DIAGNOSIS — M7711 Lateral epicondylitis, right elbow: Secondary | ICD-10-CM | POA: Diagnosis not present

## 2020-05-12 NOTE — Progress Notes (Signed)
New Patient Visit  Assessment: Sandy Phillips is a 50 y.o. RHD female with the following: Right elbow lateral epicondylitis  Plan: Sandy Phillips has painful right elbow lateral epicondylitis.  She has tried a strap for her elbow with limited relief.  She has been taking ibuprofen with limited relief.  In great detail, I discussed the natural progression of this problem.  I provided her with a list of activities and exercises she can do on her own.  If these are not helpful, we can consider formal PT.  Similarly, we could plan for an injection if she is interested.  Rarely does this condition require surgical intervention and is often self-limiting.  All questions were answered and she is amenable to this plan.  Follow up as needed.   Follow-up: Return if symptoms worsen or fail to improve.  Subjective:  Chief Complaint  Patient presents with  . Elbow Pain    Patient reports right elbow x 1 year, Patient reports some swelling and tender to touch,     History of Present Illness: Sandy Phillips is a 50 y.o. RHD female who has been referred to clinic today by Moshe Cipro, NP for evaluation of right elbow pain.  She has had lateral right elbow pain for the last year.  She denies a specific injury.  She had taken oral steroids without improvement, and also had a reaction to the medication including swelling.  In addition, she has tried ibuprofen with little relief in her pain.  She has worn a compression sleeve which caused more pain, and a counter force strap on her forearm was uncomfortable.  No attempts at therapy.  No injections.  She states the pain radiates distally to her hand.  She also has some numbness in her fingertips, with a history of carpal tunnel release.  The pain is worse in the morning.    Review of Systems: No fevers or chills +numbness or tingling No chest pain No shortness of breath No bowel or bladder dysfunction No GI distress No headaches   Medical History:  History  reviewed. No pertinent past medical history.  Past Surgical History:  Procedure Laterality Date  . ABDOMINAL HYSTERECTOMY    . ABLATION    . APPENDECTOMY    . BREAST SURGERY    . CARPAL TUNNEL RELEASE    . FOOT SURGERY    . TUBAL LIGATION      Family History  Problem Relation Age of Onset  . Cancer Mother   . Heart disease Father    Social History   Tobacco Use  . Smoking status: Former Games developer  . Smokeless tobacco: Never Used  Vaping Use  . Vaping Use: Never used  Substance Use Topics  . Alcohol use: Never  . Drug use: Never    Allergies  Allergen Reactions  . Celexa  [Citalopram Hydrobromide]     Other reaction(s): Chest Pain  . Penicillins Anaphylaxis and Rash  . Bupropion Anxiety, Nausea Only and Palpitations  . Marcaine  [Bupivacaine Hcl]     Other reaction(s): Other blisters  . Codeine Hives  . Lidocaine     Pt stated "Allergic to lidocaine injections in mouth, causes blisters in mouth"   . Hydrocodone-Acetaminophen Rash    Current Meds  Medication Sig  . albuterol (VENTOLIN HFA) 108 (90 Base) MCG/ACT inhaler Inhale 1-2 puffs into the lungs every 6 (six) hours as needed for wheezing or shortness of breath.  Marland Kitchen buPROPion (WELLBUTRIN XL) 150 MG 24 hr tablet TAKE  1 TABLET(150 MG) BY MOUTH DAILY  . buPROPion (WELLBUTRIN XL) 150 MG 24 hr tablet Take 150 mg by mouth at bedtime.  . cephALEXin (KEFLEX) 500 MG capsule Take 500 mg by mouth 4 (four) times daily.  . chlorhexidine (PERIDEX) 0.12 % solution Use as directed 15 mLs in the mouth or throat 2 (two) times daily.  . diclofenac Sodium (VOLTAREN) 1 % GEL Apply 4 g topically 4 (four) times daily.  Marland Kitchen estradiol (ESTRACE) 0.1 MG/GM vaginal cream 1 gram of cream vaginally every night for 2 weeks then 1 gram of cream vaginally twice a week thereafter.  Marland Kitchen gentamicin cream (GARAMYCIN) 0.1 % APPLY EXTERNALLY TO THE AFFECTED AREA TWICE DAILY  . hydrOXYzine (ATARAX/VISTARIL) 25 MG tablet Take 25 mg by mouth 3 (three) times  daily.  Marland Kitchen ibuprofen (ADVIL) 800 MG tablet Take 800 mg by mouth every 8 (eight) hours as needed.  . Lidocaine (HM LIDOCAINE PATCH) 4 % PTCH Apply 1 each topically 2 (two) times daily as needed (as needed for pain).  . Olopatadine HCl 0.2 % SOLN Apply 1 drop to eye daily.    Objective: BP 124/76   Pulse 96   Ht 5\' 2"  (1.575 m)   BMI 31.05 kg/m   Physical Exam:  General: Alert and oriented, no acute distress Gait: Normal  Right elbow without deformity or swelling.  No gross instability.  Tenderness to palpation over the later epicondyle.  Pain with resisted wrist extension.  Pain is recreated with resisted long finger extension.  Decreased sensation to index, long and ring fingers.   Well healed incision overlying the transverse carpal ligament.    IMAGING: No new imaging obtained today   New Medications:  No orders of the defined types were placed in this encounter.     , MD  05/12/2020 2:30 PM

## 2020-06-19 ENCOUNTER — Other Ambulatory Visit: Payer: Self-pay | Admitting: Podiatry

## 2020-06-20 NOTE — Telephone Encounter (Signed)
Please advise 

## 2020-06-23 ENCOUNTER — Ambulatory Visit
Admission: EM | Admit: 2020-06-23 | Discharge: 2020-06-23 | Disposition: A | Discharge status: Home / Self Care | Payer: Medicare Other | Attending: Physician Assistant | Admitting: Physician Assistant

## 2020-06-23 ENCOUNTER — Encounter: Payer: Self-pay | Admitting: Emergency Medicine

## 2020-06-23 ENCOUNTER — Other Ambulatory Visit: Payer: Self-pay

## 2020-06-23 DIAGNOSIS — R300 Dysuria: Secondary | ICD-10-CM | POA: Diagnosis present

## 2020-06-23 LAB — POCT URINALYSIS DIP (MANUAL ENTRY)
Bilirubin, UA: NEGATIVE
Glucose, UA: NEGATIVE mg/dL
Ketones, POC UA: NEGATIVE mg/dL
Leukocytes, UA: NEGATIVE
Nitrite, UA: NEGATIVE
Protein Ur, POC: NEGATIVE mg/dL
Spec Grav, UA: 1.02 (ref 1.010–1.025)
Urobilinogen, UA: 0.2 E.U./dL
pH, UA: 6 (ref 5.0–8.0)

## 2020-06-23 MED ORDER — NITROFURANTOIN MONOHYD MACRO 100 MG PO CAPS: 100.0000 mg | ORAL_CAPSULE | Freq: Two times a day (BID) | ORAL | 0 refills | Status: AC

## 2020-06-23 MED ORDER — FLUCONAZOLE 150 MG PO TABS: 150.0000 mg | ORAL_TABLET | Freq: Every day | ORAL | 1 refills | Status: AC

## 2020-06-23 NOTE — Discharge Instructions (Signed)
Your urine culture is pending 

## 2020-06-23 NOTE — ED Triage Notes (Signed)
Burning with urination x 1 week.  Tried azo last week and felt better but it has came.

## 2020-06-24 NOTE — ED Provider Notes (Signed)
[facility]    CSN: [id number] Arrival date & time: 06/23/20  1700      History   Chief Complaint No chief complaint on file.   HPI Sandy Phillips is a 50 y.o. female.   The history is provided by the patient. No language interpreter was used.  Dysuria Pain quality:  Aching Pain severity:  Moderate Onset quality:  Gradual Timing:  Constant Progression:  Worsening Chronicity:  New Relieved by:  Nothing Worsened by:  Nothing Ineffective treatments:  None tried Urinary symptoms: foul-smelling urine   Risk factors: no sexually transmitted infections     History reviewed. No pertinent past medical history.  Patient Active Problem List   Diagnosis Date Noted  . Tobacco use 12/29/2018  . Anxiety 06/25/2014  . Bipolar disorder (HCC) 06/25/2014  . Low back pain 06/25/2014  . S/P mastectomy, bilateral 02/26/2014  . Internal hemorrhoids 04/18/2012    Past Surgical History:  Procedure Laterality Date  . ABDOMINAL HYSTERECTOMY    . ABLATION    . APPENDECTOMY    . BREAST SURGERY    . CARPAL TUNNEL RELEASE    . FOOT SURGERY    . TUBAL LIGATION      OB History   No obstetric history on file.      Home Medications    Prior to Admission medications   Medication Sig Start Date End Date Taking? Authorizing Provider  fluconazole (DIFLUCAN) 150 MG tablet Take 1 tablet (150 mg total) by mouth daily. 06/23/20  Yes Cheron Schaumann K, PA-C  nitrofurantoin, macrocrystal-monohydrate, (MACROBID) 100 MG capsule Take 1 capsule (100 mg total) by mouth 2 (two) times daily for 5 days. 06/23/20 06/28/20 Yes Elson Areas, PA-C  albuterol (VENTOLIN HFA) 108 (90 Base) MCG/ACT inhaler Inhale 1-2 puffs into the lungs every 6 (six) hours as needed for wheezing or shortness of breath. 10/13/19   Avegno, Zachery Dakins, FNP  buPROPion (WELLBUTRIN XL) 150 MG 24 hr tablet TAKE 1 TABLET(150 MG) BY MOUTH DAILY 11/14/19   [provider]  buPROPion (WELLBUTRIN XL) 150 MG 24 hr tablet  Take 150 mg by mouth at bedtime. 11/14/19   [provider]  cephALEXin (KEFLEX) 500 MG capsule Take 500 mg by mouth 4 (four) times daily. 07/25/19   [provider]  chlorhexidine (PERIDEX) 0.12 % solution Use as directed 15 mLs in the mouth or throat 2 (two) times daily. 07/10/19   Wurst, [place], PA-C  diclofenac Sodium (VOLTAREN) 1 % GEL Apply 4 g topically 4 (four) times daily. 04/28/20   Moshe Cipro, NP  estradiol (ESTRACE) 0.1 MG/GM vaginal cream 1 gram of cream vaginally every night for 2 weeks then 1 gram of cream vaginally twice a week thereafter. 12/26/19   [provider]  fluticasone (FLONASE) 50 MCG/ACT nasal spray Place 1 spray into both nostrils daily for 14 days. 03/01/20 03/15/20  Avegno, Zachery Dakins, FNP  gentamicin cream (GARAMYCIN) 0.1 % APPLY EXTERNALLY TO THE AFFECTED AREA TWICE DAILY 07/10/19   [provider]  hydrOXYzine (ATARAX/VISTARIL) 25 MG tablet Take 25 mg by mouth 3 (three) times daily. 10/17/19   [provider]  ibuprofen (ADVIL) 800 MG tablet Take 800 mg by mouth every 8 (eight) hours as needed.    [provider]  Lidocaine (HM LIDOCAINE PATCH) 4 % PTCH Apply 1 each topically 2 (two) times daily as needed (as needed for pain). 04/28/20   Moshe Cipro, NP  meloxicam (MOBIC) 15 MG tablet TAKE 1 TABLET(15 MG)  BY MOUTH DAILY 06/20/20   Felecia Shelling, DPM  Olopatadine HCl 0.2 % SOLN Apply 1 drop to eye daily. 10/13/19   Avegno, Zachery Dakins, FNP  sertraline (ZOLOFT) 50 MG tablet Take by mouth. 01/29/17 07/10/19  [provider]  zolpidem (AMBIEN) 5 MG tablet Take 1 tablet (5 mg total) by mouth at bedtime as needed for sleep. 01/09/19 07/10/19  Felecia Shelling, DPM    Family History Family History  Problem Relation Age of Onset  . Cancer Mother   . Heart disease Father     Social History Social History   Tobacco Use  . Smoking status: Former Games developer  . Smokeless tobacco: Never Used  Vaping Use   . Vaping Use: Never used  Substance Use Topics  . Alcohol use: Never  . Drug use: Never     Allergies   Celexa  [citalopram hydrobromide], Penicillins, Bupropion, Marcaine  [bupivacaine hcl], Codeine, Lidocaine, and Hydrocodone-acetaminophen   Review of Systems Review of Systems  Genitourinary: Positive for dysuria.  All other systems reviewed and are negative.    Physical Exam Triage Vital Signs ED Triage Vitals  Enc Vitals Group     BP 06/23/20 1730 97/69     Pulse Rate 06/23/20 1730 (!) 108     Resp 06/23/20 1730 16     Temp 06/23/20 1730 98.1 F (36.7 C)     Temp Source 06/23/20 1730 Oral     SpO2 06/23/20 1730 95 %     Weight --      Height --      Head Circumference --      Peak Flow --      Pain Score 06/23/20 1733 0     Pain Loc --      Pain Edu? --      Excl. in GC? --    No data found.  Updated Vital Signs BP 97/69 (BP Location: Right Arm)   Pulse 86   Temp 98.1 F (36.7 C) (Oral)   Resp 16   SpO2 95%   Visual Acuity Right Eye Distance:   Left Eye Distance:   Bilateral Distance:    Right Eye Near:   Left Eye Near:    Bilateral Near:     Physical Exam Vitals and nursing note reviewed.  Constitutional:      Appearance: She is well-developed and well-nourished.  HENT:     Head: Normocephalic.  Eyes:     Extraocular Movements: EOM normal.  Pulmonary:     Effort: Pulmonary effort is normal.  Abdominal:     General: There is no distension.  Musculoskeletal:        General: Normal range of motion.     Cervical back: Normal range of motion.  Neurological:     Mental Status: She is alert and oriented to person, place, and time.  Psychiatric:        Mood and Affect: Mood and affect and mood normal.      UC Treatments / Results  Labs (all labs ordered are listed, but only abnormal results are displayed) Labs Reviewed  POCT URINALYSIS DIP (MANUAL ENTRY) - Abnormal; Notable for the following components:      Result Value   Blood, UA  trace-intact (*)    All other components within normal limits  URINE CULTURE    EKG   Radiology No results found.  Procedures Procedures (including critical care time)  Medications Ordered in UC Medications - No data to display  Initial Impression / Assessment and Plan / UC Course  I have reviewed the triage vital signs and the nursing notes.  Pertinent labs & imaging results that were available during my care of the patient were reviewed by me and considered in my medical decision making (see chart for details).     Pt given rx for madrobid and diflucan  Culture pending.  I suspect uti  Final Clinical Impressions(s) / UC Diagnoses   Final diagnoses:  Dysuria     Discharge Instructions     Your urine culture is pending   ED Prescriptions    Medication Sig Dispense Auth. Provider   nitrofurantoin, macrocrystal-monohydrate, (MACROBID) 100 MG capsule Take 1 capsule (100 mg total) by mouth 2 (two) times daily for 5 days. 10 capsule Sofia, Leslie K, PA-C   fluconazole (DIFLUCAN) 150 MG tablet Take 1 tablet (150 mg total) by mouth daily. 1 tablet Elson Areas, [place]     PDMP not reviewed this encounter.  An After Visit Summary was printed and given to the patient.    Elson Areas, [place] 06/24/20 [phone]

## 2020-06-25 LAB — URINE CULTURE

## 2020-09-13 ENCOUNTER — Encounter: Payer: Self-pay | Admitting: Emergency Medicine

## 2020-09-13 ENCOUNTER — Ambulatory Visit
Admission: EM | Admit: 2020-09-13 | Discharge: 2020-09-13 | Disposition: A | Discharge status: Home / Self Care | Payer: Medicare Other | Attending: Family Medicine | Admitting: Family Medicine

## 2020-09-13 ENCOUNTER — Other Ambulatory Visit: Payer: Self-pay

## 2020-09-13 DIAGNOSIS — J209 Acute bronchitis, unspecified: Secondary | ICD-10-CM

## 2020-09-13 DIAGNOSIS — J069 Acute upper respiratory infection, unspecified: Secondary | ICD-10-CM

## 2020-09-13 MED ORDER — IPRATROPIUM BROMIDE 0.03 % NA SOLN: 2.0000 | Freq: Three times a day (TID) | NASAL | 0 refills | Status: AC | PRN

## 2020-09-13 MED ORDER — DOXYCYCLINE HYCLATE 100 MG PO CAPS: 100.0000 mg | ORAL_CAPSULE | Freq: Two times a day (BID) | ORAL | 0 refills | Status: DC

## 2020-09-13 NOTE — ED Provider Notes (Signed)
[facility]    CSN: [id number] Arrival date & time: 09/13/20  1934      History   Chief Complaint Chief Complaint  Patient presents with  . URI    HPI Sandy Phillips is a 50 y.o. female.   HPI  Patient presents with URI symptoms including cough, sore throat, otalgia, nasal congestion, runny nose, and sinus pressure. Spouse dx with COVID 2 weeks ago and she has had 2 negative COVID test. denies symptoms of shortness of breath, weakness, N&V, chest pain. Taking a few doses of sudafed with temporary symptom improvement t Sudafed,  History reviewed. No pertinent past medical history.  Patient Active Problem List   Diagnosis Date Noted  . Tobacco use 12/29/2018  . Anxiety 06/25/2014  . Bipolar disorder (HCC) 06/25/2014  . Low back pain 06/25/2014  . S/P mastectomy, bilateral 02/26/2014  . Internal hemorrhoids 04/18/2012    Past Surgical History:  Procedure Laterality Date  . ABDOMINAL HYSTERECTOMY    . ABLATION    . APPENDECTOMY    . BREAST SURGERY    . CARPAL TUNNEL RELEASE    . FOOT SURGERY    . TUBAL LIGATION      OB History   No obstetric history on file.      Home Medications    Prior to Admission medications   Medication Sig Start Date End Date Taking? Authorizing Provider  albuterol (VENTOLIN HFA) 108 (90 Base) MCG/ACT inhaler Inhale 1-2 puffs into the lungs every 6 (six) hours as needed for wheezing or shortness of breath. 10/13/19   Avegno, Zachery Dakins, FNP  buPROPion (WELLBUTRIN XL) 150 MG 24 hr tablet TAKE 1 TABLET(150 MG) BY MOUTH DAILY 11/14/19   [provider]  buPROPion (WELLBUTRIN XL) 150 MG 24 hr tablet Take 150 mg by mouth at bedtime. 11/14/19   [provider]  cephALEXin (KEFLEX) 500 MG capsule Take 500 mg by mouth 4 (four) times daily. 07/25/19   [provider]  chlorhexidine (PERIDEX) 0.12 % solution Use as directed 15 mLs in the mouth or throat 2 (two) times daily. 07/10/19   Wurst, [place], PA-C   diclofenac Sodium (VOLTAREN) 1 % GEL Apply 4 g topically 4 (four) times daily. 04/28/20   Moshe Cipro, NP  estradiol (ESTRACE) 0.1 MG/GM vaginal cream 1 gram of cream vaginally every night for 2 weeks then 1 gram of cream vaginally twice a week thereafter. 12/26/19   [provider]  fluconazole (DIFLUCAN) 150 MG tablet Take 1 tablet (150 mg total) by mouth daily. 06/23/20   Elson Areas, PA-C  fluticasone (FLONASE) 50 MCG/ACT nasal spray Place 1 spray into both nostrils daily for 14 days. 03/01/20 03/15/20  Avegno, Zachery Dakins, FNP  gentamicin cream (GARAMYCIN) 0.1 % APPLY EXTERNALLY TO THE AFFECTED AREA TWICE DAILY 07/10/19   [provider]  hydrOXYzine (ATARAX/VISTARIL) 25 MG tablet Take 25 mg by mouth 3 (three) times daily. 10/17/19   [provider]  ibuprofen (ADVIL) 800 MG tablet Take 800 mg by mouth every 8 (eight) hours as needed.    [provider]  Lidocaine (HM LIDOCAINE PATCH) 4 % PTCH Apply 1 each topically 2 (two) times daily as needed (as needed for pain). 04/28/20   Moshe Cipro, NP  meloxicam (MOBIC) 15 MG tablet TAKE 1 TABLET(15 MG) BY MOUTH DAILY 06/20/20   Felecia Shelling, DPM  Olopatadine HCl 0.2 % SOLN Apply 1 drop to eye daily. 10/13/19   Avegno, Zachery Dakins, FNP  sertraline (  ZOLOFT) 50 MG tablet Take by mouth. 01/29/17 07/10/19  [provider]  zolpidem (AMBIEN) 5 MG tablet Take 1 tablet (5 mg total) by mouth at bedtime as needed for sleep. 01/09/19 07/10/19  Felecia Shelling, DPM    Family History Family History  Problem Relation Age of Onset  . Cancer Mother   . Heart disease Father     Social History Social History   Tobacco Use  . Smoking status: Former Games developer  . Smokeless tobacco: Never Used  Vaping Use  . Vaping Use: Never used  Substance Use Topics  . Alcohol use: Never  . Drug use: Never     Allergies   Celexa  [citalopram hydrobromide], Penicillins, Bupropion, Marcaine  [bupivacaine hcl], Codeine,  Lidocaine, and Hydrocodone-acetaminophen   Review of Systems Review of Systems  Pertinent negatives listed in HPI  Physical Exam Triage Vital Signs ED Triage Vitals  Enc Vitals Group     BP 09/13/20 1944 110/79     Pulse Rate 09/13/20 1944 (!) 113     Resp 09/13/20 1944 20     Temp 09/13/20 1944 98.4 F (36.9 C)     Temp Source 09/13/20 1944 Oral     SpO2 09/13/20 1944 96 %     Weight --      Height --      Head Circumference --      Peak Flow --      Pain Score 09/13/20 1945 6     Pain Loc --      Pain Edu? --      Excl. in GC? --    No data found.  Updated Vital Signs BP 110/79 (BP Location: Right Arm)   Pulse (!) 113   Temp 98.4 F (36.9 C) (Oral)   Resp 20   SpO2 96%   Visual Acuity Right Eye Distance:   Left Eye Distance:   Bilateral Distance:    Right Eye Near:   Left Eye Near:    Bilateral Near:     Physical Exam . General Appearance:    Alert, cooperative, no distress  HENT:   Normocephalic, ears normal, nares mucosal edema with congestion, rhinorrhea, oropharynx    Eyes:    PERRL, conjunctiva/corneas clear, EOM's intact       Lungs:     Coarse lung sounds (BL upper bronchials), respirations unlabored  Heart:    Regular rate and rhythm  Neurologic:   Awake, alert, oriented x 3. No apparent focal neurological           defect.      UC Treatments / Results  Labs (all labs ordered are listed, but only abnormal results are displayed) Labs Reviewed - No data to display  EKG   Radiology No results found.  Procedures Procedures (including critical care time)  Medications Ordered in UC Medications - No data to display  Initial Impression / Assessment and Plan / UC Course  I have reviewed the triage vital signs and the nursing notes.  Pertinent labs & imaging results that were available during my care of the patient were reviewed by me and considered in my medical decision making (see chart for details).    COVID/Flu test pending. Symptom  management warranted only.  Manage fever with Tylenol and ibuprofen.  Nasal symptoms with over-the-counter antihistamines recommended.  Treatment per discharge medications/discharge instructions.  Red flags/ER precautions given. The most current CDC isolation/quarantine recommendation advised.  Final Clinical Impressions(s) / UC Diagnoses   Final  diagnoses:  Viral URI with cough     Discharge Instructions     Your COVID 19 results should result within 3 days. Negative results are immediately resulted to Mychart. Positive results will receive a follow-up call from our clinic. If symptoms are present, I recommend home quarantine until results are known.  Alternate Tylenol and ibuprofen as needed for body aches and fever.  Symptom management per recommendations discussed today.  If any breathing difficulty or chest pain develops go immediately to the closest emergency department for evaluation.     ED Prescriptions    Medication Sig Dispense Auth. Provider   ipratropium (ATROVENT) 0.03 % nasal spray Place 2 sprays into both nostrils 3 (three) times daily as needed for rhinitis. 30 mL Bing Neighbors, FNP   doxycycline (VIBRAMYCIN) 100 MG capsule Take 1 capsule (100 mg total) by mouth 2 (two) times daily. 20 capsule Bing Neighbors, FNP     PDMP not reviewed this encounter.   Bing Neighbors, FNP 09/14/20 [phone]

## 2020-09-13 NOTE — ED Triage Notes (Signed)
Pt here for cold/allergy sx onset 2 days associated w/nasal drainage, left ear pain, nausea, chills, fever, facial presure  Denies v/d  Taking OTC Tylenol and Mucinex  A&O x4... NAD>.Marland Kitchen ambulatory

## 2020-09-13 NOTE — Discharge Instructions (Addendum)
Your COVID 19 results should result within 3 days. °Negative results are immediately resulted to Mychart. Positive results will receive a follow-up call from our clinic. If symptoms are present, I recommend home quarantine until results are known.  °Alternate Tylenol and ibuprofen as needed for body aches and fever.  Symptom management per recommendations discussed today.  If any breathing difficulty or chest pain develops go immediately to the closest emergency department for evaluation.  ° °

## 2020-09-15 LAB — COVID-19, FLU A+B NAA
Influenza A, NAA: NOT DETECTED
Influenza B, NAA: NOT DETECTED
SARS-CoV-2, NAA: NOT DETECTED

## 2020-10-08 ENCOUNTER — Other Ambulatory Visit: Payer: Self-pay | Admitting: Physician Assistant

## 2020-10-08 DIAGNOSIS — H903 Sensorineural hearing loss, bilateral: Secondary | ICD-10-CM

## 2020-10-08 DIAGNOSIS — H905 Unspecified sensorineural hearing loss: Secondary | ICD-10-CM

## 2020-12-07 ENCOUNTER — Ambulatory Visit
Admission: EM | Admit: 2020-12-07 | Discharge: 2020-12-07 | Disposition: A | Discharge status: Home / Self Care | Payer: Medicare Other | Attending: Emergency Medicine | Admitting: Emergency Medicine

## 2020-12-07 ENCOUNTER — Encounter: Payer: Self-pay | Admitting: Emergency Medicine

## 2020-12-07 DIAGNOSIS — H1013 Acute atopic conjunctivitis, bilateral: Secondary | ICD-10-CM

## 2020-12-07 DIAGNOSIS — K047 Periapical abscess without sinus: Secondary | ICD-10-CM

## 2020-12-07 MED ORDER — CLINDAMYCIN HCL 300 MG PO CAPS: 300.0000 mg | ORAL_CAPSULE | Freq: Three times a day (TID) | ORAL | 0 refills | Status: AC

## 2020-12-07 MED ORDER — OLOPATADINE HCL 0.1 % OP SOLN: 1.0000 [drp] | Freq: Two times a day (BID) | OPHTHALMIC | 0 refills | Status: AC

## 2020-12-07 MED ORDER — ERYTHROMYCIN 5 MG/GM OP OINT: TOPICAL_OINTMENT | OPHTHALMIC | 0 refills | Status: AC

## 2020-12-07 NOTE — ED Triage Notes (Addendum)
watery eyes since yesterday pt states she had crust on her eyes this morning had a use a warm rag

## 2020-12-07 NOTE — ED Provider Notes (Signed)
[facility]    CSN: [id number] Arrival date & time: 12/07/20  1754      History   Chief Complaint No chief complaint on file. Itchy eyes/gum swelling  HPI Sandy Phillips is a 50 y.o. female history of tobacco use, presenting today for evaluation of itchy eyes.  Reports over the past 1 to 2 days has developed itching, burning eyes.  Occasional watering.  Reports crusting in the morning, but denies any significant purulent drainage throughout the day.  Denies change in vision.  Denies contact use.  Denies recent URI symptoms.  She does express concern over possible dental infection as she has noticed an area of swelling to her gums.  Has plans to follow-up with dentistry at the end of this month.  HPI  History reviewed. No pertinent past medical history.  Patient Active Problem List   Diagnosis Date Noted   Tobacco use 12/29/2018   Anxiety 06/25/2014   Bipolar disorder (HCC) 06/25/2014   Low back pain 06/25/2014   S/P mastectomy, bilateral 02/26/2014   Internal hemorrhoids 04/18/2012    Past Surgical History:  Procedure Laterality Date   ABDOMINAL HYSTERECTOMY     ABLATION     APPENDECTOMY     BREAST SURGERY     CARPAL TUNNEL RELEASE     FOOT SURGERY     TUBAL LIGATION      OB History   No obstetric history on file.      Home Medications    Prior to Admission medications   Medication Sig Start Date End Date Taking? Authorizing Provider  clindamycin (CLEOCIN) 300 MG capsule Take 1 capsule (300 mg total) by mouth 3 (three) times daily for 7 days. 12/07/20 12/14/20 Yes Icker Swigert C, PA-C  erythromycin ophthalmic ointment Place a 1/2 inch ribbon of ointment into the lower eyelid 4 times daily x 5 days 12/07/20  Yes Fay Swider C, PA-C  olopatadine (PATANOL) 0.1 % ophthalmic solution Place 1 drop into both eyes 2 (two) times daily. 12/07/20  Yes Tj Kitchings C, PA-C  albuterol (VENTOLIN HFA) 108 (90 Base) MCG/ACT inhaler Inhale 1-2 puffs into the lungs  every 6 (six) hours as needed for wheezing or shortness of breath. 10/13/19   Avegno, Zachery Dakins, FNP  buPROPion (WELLBUTRIN XL) 150 MG 24 hr tablet TAKE 1 TABLET(150 MG) BY MOUTH DAILY 11/14/19   [provider]  buPROPion (WELLBUTRIN XL) 150 MG 24 hr tablet Take 150 mg by mouth at bedtime. 11/14/19   [provider]  chlorhexidine (PERIDEX) 0.12 % solution Use as directed 15 mLs in the mouth or throat 2 (two) times daily. 07/10/19   Wurst, [place], PA-C  diclofenac Sodium (VOLTAREN) 1 % GEL Apply 4 g topically 4 (four) times daily. 04/28/20   Moshe Cipro, NP  estradiol (ESTRACE) 0.1 MG/GM vaginal cream 1 gram of cream vaginally every night for 2 weeks then 1 gram of cream vaginally twice a week thereafter. 12/26/19   [provider]  fluconazole (DIFLUCAN) 150 MG tablet Take 1 tablet (150 mg total) by mouth daily. 06/23/20   Elson Areas, PA-C  fluticasone (FLONASE) 50 MCG/ACT nasal spray Place 1 spray into both nostrils daily for 14 days. 03/01/20 03/15/20  Avegno, Zachery Dakins, FNP  gentamicin cream (GARAMYCIN) 0.1 % APPLY EXTERNALLY TO THE AFFECTED AREA TWICE DAILY 07/10/19   [provider]  hydrOXYzine (ATARAX/VISTARIL) 25 MG tablet Take 25 mg by mouth 3 (three) times daily. 10/17/19   [provider]  ibuprofen (  ADVIL) 800 MG tablet Take 800 mg by mouth every 8 (eight) hours as needed.    [provider]  ipratropium (ATROVENT) 0.03 % nasal spray Place 2 sprays into both nostrils 3 (three) times daily as needed for rhinitis. 09/13/20   Bing Neighbors, FNP  Lidocaine (HM LIDOCAINE PATCH) 4 % PTCH Apply 1 each topically 2 (two) times daily as needed (as needed for pain). 04/28/20   Moshe Cipro, NP  meloxicam (MOBIC) 15 MG tablet TAKE 1 TABLET(15 MG) BY MOUTH DAILY 06/20/20   Felecia Shelling, DPM  sertraline (ZOLOFT) 50 MG tablet Take by mouth. 01/29/17 07/10/19  [provider]  zolpidem (AMBIEN) 5 MG tablet Take 1 tablet (5  mg total) by mouth at bedtime as needed for sleep. 01/09/19 07/10/19  Felecia Shelling, DPM    Family History Family History  Problem Relation Age of Onset   Cancer Mother    Heart disease Father     Social History Social History   Tobacco Use   Smoking status: Former   Smokeless tobacco: Never  Building services engineer Use: Never used  Substance Use Topics   Alcohol use: Never   Drug use: Never     Allergies   Celexa  [citalopram hydrobromide], Penicillins, Bupropion, Marcaine  [bupivacaine hcl], Codeine, Lidocaine, and Hydrocodone-acetaminophen   Review of Systems Review of Systems  Constitutional:  Negative for activity change, appetite change, chills, fatigue and fever.  HENT:  Positive for dental problem. Negative for congestion, ear pain, rhinorrhea, sinus pressure, sore throat and trouble swallowing.   Eyes:  Positive for itching. Negative for discharge and redness.  Respiratory:  Negative for cough, chest tightness and shortness of breath.   Cardiovascular:  Negative for chest pain.  Gastrointestinal:  Negative for abdominal pain, diarrhea, nausea and vomiting.  Musculoskeletal:  Negative for myalgias.  Skin:  Negative for rash.  Neurological:  Negative for dizziness, light-headedness and headaches.    Physical Exam Triage Vital Signs ED Triage Vitals  Enc Vitals Group     BP      Pulse      Resp      Temp      Temp src      SpO2      Weight      Height      Head Circumference      Peak Flow      Pain Score      Pain Loc      Pain Edu?      Excl. in GC?    No data found.  Updated Vital Signs BP 117/81 (BP Location: Right Arm)   Pulse 100   Temp 97.9 F (36.6 C) (Oral)   Resp 17   SpO2 95%   Visual Acuity Right Eye Distance:   Left Eye Distance:   Bilateral Distance:    Right Eye Near:   Left Eye Near:    Bilateral Near:     Physical Exam Vitals and nursing note reviewed.  Constitutional:      Appearance: She is well-developed.      Comments: No acute distress  HENT:     Head: Normocephalic and atraumatic.     Nose: Nose normal.     Mouth/Throat:     Comments: Area of swelling and gingival erythema noted to left upper jaw just above left canine Eyes:     Conjunctiva/sclera: Conjunctivae normal.     Comments: Minimal conjunctival erythema, no discharge noted,  no photophobia with exam, anterior chamber clear  Cardiovascular:     Rate and Rhythm: Normal rate.  Pulmonary:     Effort: Pulmonary effort is normal. No respiratory distress.  Abdominal:     General: There is no distension.  Musculoskeletal:        General: Normal range of motion.     Cervical back: Neck supple.  Skin:    General: Skin is warm and dry.  Neurological:     Mental Status: She is alert and oriented to person, place, and time.     UC Treatments / Results  Labs (all labs ordered are listed, but only abnormal results are displayed) Labs Reviewed - No data to display  EKG   Radiology No results found.  Procedures Procedures (including critical care time)  Medications Ordered in UC Medications - No data to display  Initial Impression / Assessment and Plan / UC Course  I have reviewed the triage vital signs and the nursing notes.  Pertinent labs & imaging results that were available during my care of the patient were reviewed by me and considered in my medical decision making (see chart for details).     Conjunctivitis-suspect likely allergic, lower suspicion of bacterial cause, treated with olopatadine, but did also provide erythromycin to cover for possible early bacterial etiology. Dental infection-clindamycin x1 week, no sign of abscess  Discussed strict return precautions. Patient verbalized understanding and is agreeable with plan.  Final Clinical Impressions(s) / UC Diagnoses   Final diagnoses:  Allergic conjunctivitis of both eyes  Dental infection     Discharge Instructions      Use olopatadine drops twice  daily to help with itching and burning Erythromycin ointment 4 times into lower lid Alternate warm and cool compresses to eyes  Begin clindamycin x1 week to help with any underlying dental infection/abscess developing  Follow-up if not improving or worsening     ED Prescriptions     Medication Sig Dispense Auth. Provider   olopatadine (PATANOL) 0.1 % ophthalmic solution Place 1 drop into both eyes 2 (two) times daily. 5 mL Shyne Resch C, PA-C   erythromycin ophthalmic ointment Place a 1/2 inch ribbon of ointment into the lower eyelid 4 times daily x 5 days 3.5 g Tamyia Minich C, PA-C   clindamycin (CLEOCIN) 300 MG capsule Take 1 capsule (300 mg total) by mouth 3 (three) times daily for 7 days. 21 capsule Taneia Mealor, [place] C, PA-C      PDMP not reviewed this encounter.   Lew Dawes, [place] 12/07/20 [id number]

## 2020-12-07 NOTE — Discharge Instructions (Addendum)
Use olopatadine drops twice daily to help with itching and burning Erythromycin ointment 4 times into lower lid Alternate warm and cool compresses to eyes  Begin clindamycin x1 week to help with any underlying dental infection/abscess developing  Follow-up if not improving or worsening

## 2021-03-09 ENCOUNTER — Other Ambulatory Visit: Payer: Self-pay

## 2021-03-09 ENCOUNTER — Ambulatory Visit (INDEPENDENT_AMBULATORY_CARE_PROVIDER_SITE_OTHER): Payer: Medicare Other | Admitting: Podiatry

## 2021-03-09 DIAGNOSIS — L989 Disorder of the skin and subcutaneous tissue, unspecified: Secondary | ICD-10-CM

## 2021-03-09 NOTE — Progress Notes (Signed)
   Subjective: 50 y.o. female presenting to the office today for evaluation of a symptomatic skin lesion to the medial aspect of the right foot.  Patient states that she has a little piece of skin that is very tender with walking.  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  She says that overall she is doing very well except for the symptomatic skin lesion.   No past medical history on file.   Objective:  Physical Exam General: Alert and oriented x3 in no acute distress  Dermatology: Hyperkeratotic lesion(s) present on the plantar medial aspect of the right foot. Pain on palpation with a central nucleated core noted. Skin is warm, dry and supple bilateral lower extremities. Negative for open lesions or macerations.  Vascular: Palpable pedal pulses bilaterally. No edema or erythema noted. Capillary refill within normal limits.  Neurological: Epicritic and protective threshold grossly intact bilaterally.   Musculoskeletal Exam: No pedal deformities noted  Assessment: 1.  Symptomatic benign skin lesion plantar medial aspect of the right foot   Plan of Care:  1. Patient evaluated 2. Excisional debridement of keratoic lesion(s) using a chisel blade was performed without incident.  3.  Recommend corn and callus remover x1 week  4. Patient is to return to the clinic PRN.   Felecia Shelling, DPM [facility]  Dr. Felecia Shelling, DPM    [address] [place], [place] [address]                Office [phone]  Fax [phone]

## 2021-05-16 ENCOUNTER — Ambulatory Visit (INDEPENDENT_AMBULATORY_CARE_PROVIDER_SITE_OTHER): Payer: Medicare Other

## 2021-05-16 ENCOUNTER — Other Ambulatory Visit: Payer: Self-pay

## 2021-05-16 ENCOUNTER — Ambulatory Visit
Admission: EM | Admit: 2021-05-16 | Discharge: 2021-05-16 | Disposition: A | Discharge status: Home / Self Care | Payer: Medicare Other | Attending: Urgent Care | Admitting: Urgent Care

## 2021-05-16 DIAGNOSIS — M79641 Pain in right hand: Secondary | ICD-10-CM

## 2021-05-16 DIAGNOSIS — M79644 Pain in right finger(s): Secondary | ICD-10-CM | POA: Diagnosis not present

## 2021-05-16 MED ORDER — NAPROXEN 375 MG PO TABS: 375.0000 mg | ORAL_TABLET | Freq: Two times a day (BID) | ORAL | 0 refills | Status: DC

## 2021-05-16 NOTE — ED Provider Notes (Signed)
-[facility]   MRN: [id number] DOB: May 21, 1970  Subjective:   Sandy Phillips is a 51 y.o. female presenting for 2-day history of persistent right ring finger and right hand pain.  Symptoms started after patient went bowling.  Cannot recall any particular trauma or inciting event but notes that it started hurting after work.  She has used some ibuprofen with minimal relief.  She did have a ring on that finger and has taken it off to avoid further swelling having the ring get stuck on her finger.  No ecchymosis, fingernail pain, bony deformity, history of rheumatoid arthritis or arthritis in general.  No current facility-administered medications for this encounter.  Current Outpatient Medications:    albuterol (VENTOLIN HFA) 108 (90 Base) MCG/ACT inhaler, Inhale 1-2 puffs into the lungs every 6 (six) hours as needed for wheezing or shortness of breath., Disp: 18 g, Rfl: 0   buPROPion (WELLBUTRIN XL) 150 MG 24 hr tablet, TAKE 1 TABLET(150 MG) BY MOUTH DAILY, Disp: , Rfl:    buPROPion (WELLBUTRIN XL) 150 MG 24 hr tablet, Take 150 mg by mouth at bedtime., Disp: , Rfl:    chlorhexidine (PERIDEX) 0.12 % solution, Use as directed 15 mLs in the mouth or throat 2 (two) times daily., Disp: 473 mL, Rfl: 0   diclofenac Sodium (VOLTAREN) 1 % GEL, Apply 4 g topically 4 (four) times daily., Disp: 100 g, Rfl: 0   erythromycin ophthalmic ointment, Place a 1/2 inch ribbon of ointment into the lower eyelid 4 times daily x 5 days, Disp: 3.5 g, Rfl: 0   estradiol (ESTRACE) 0.1 MG/GM vaginal cream, 1 gram of cream vaginally every night for 2 weeks then 1 gram of cream vaginally twice a week thereafter., Disp: , Rfl:    fluconazole (DIFLUCAN) 150 MG tablet, Take 1 tablet (150 mg total) by mouth daily., Disp: 1 tablet, Rfl: 1   fluticasone (FLONASE) 50 MCG/ACT nasal spray, Place 1 spray into both nostrils daily for 14 days., Disp: 16 g, Rfl: 0   gentamicin cream (GARAMYCIN) 0.1 %, APPLY EXTERNALLY TO THE  AFFECTED AREA TWICE DAILY, Disp: , Rfl:    hydrOXYzine (ATARAX/VISTARIL) 25 MG tablet, Take 25 mg by mouth 3 (three) times daily., Disp: , Rfl:    ibuprofen (ADVIL) 800 MG tablet, Take 800 mg by mouth every 8 (eight) hours as needed., Disp: , Rfl:    ipratropium (ATROVENT) 0.03 % nasal spray, Place 2 sprays into both nostrils 3 (three) times daily as needed for rhinitis., Disp: 30 mL, Rfl: 0   Lidocaine (HM LIDOCAINE PATCH) 4 % PTCH, Apply 1 each topically 2 (two) times daily as needed (as needed for pain)., Disp: 30 patch, Rfl: 0   meloxicam (MOBIC) 15 MG tablet, TAKE 1 TABLET(15 MG) BY MOUTH DAILY, Disp: 30 tablet, Rfl: 1   olopatadine (PATANOL) 0.1 % ophthalmic solution, Place 1 drop into both eyes 2 (two) times daily., Disp: 5 mL, Rfl: 0   Allergies  Allergen Reactions   Celexa  [Citalopram Hydrobromide]     Other reaction(s): Chest Pain   Penicillins Anaphylaxis and Rash   Bupropion Anxiety, Nausea Only and Palpitations   Marcaine  [Bupivacaine Hcl]     Other reaction(s): Other blisters   Codeine Hives   Lidocaine     Pt stated "Allergic to lidocaine injections in mouth, causes blisters in mouth"    Hydrocodone-Acetaminophen Rash    History reviewed. No pertinent past medical history.   Past Surgical History:  Procedure Laterality Date  ABDOMINAL HYSTERECTOMY     ABLATION     APPENDECTOMY     BREAST SURGERY     CARPAL TUNNEL RELEASE     FOOT SURGERY     TUBAL LIGATION      Family History  Problem Relation Age of Onset   Cancer Mother    Heart disease Father     Social History   Tobacco Use   Smoking status: Former   Smokeless tobacco: Never  Building services engineer Use: Never used  Substance Use Topics   Alcohol use: Never   Drug use: Never    ROS   Objective:   Vitals: BP 111/74 (BP Location: Right Arm)    Pulse 100    Temp 97.9 F (36.6 C) (Oral)    Resp 16    SpO2 94%   Physical Exam Constitutional:      General: She is not in acute distress.     Appearance: Normal appearance. She is well-developed. She is not ill-appearing.  HENT:     Head: Normocephalic and atraumatic.     Nose: Nose normal.     Mouth/Throat:     Mouth: Mucous membranes are moist.  Eyes:     General: No scleral icterus.    Extraocular Movements: Extraocular movements intact.  Cardiovascular:     Rate and Rhythm: Normal rate.  Pulmonary:     Effort: Pulmonary effort is normal.  Musculoskeletal:       Hands:  Skin:    General: Skin is warm and dry.  Neurological:     General: No focal deficit present.     Mental Status: She is alert and oriented to person, place, and time.  Psychiatric:        Mood and Affect: Mood normal.        Behavior: Behavior normal.    DG Hand Complete Right  Result Date: 05/16/2021 CLINICAL DATA:  Right hand pain and swelling. EXAM: RIGHT HAND - COMPLETE 3+ VIEW COMPARISON:  None. FINDINGS: There is no evidence of fracture or dislocation. There is no evidence of arthropathy or other focal bone abnormality. Soft tissues are unremarkable. IMPRESSION: Negative. Electronically Signed   By: Obie Dredge M.D.   On: 05/16/2021 13:10     Assessment and Plan :   PDMP not reviewed this encounter.  1. Finger pain, right   2. Right hand pain    Will manage conservatively with buddy tape system, naproxen for pain and inflammation.  Suspected finger strain, recommended follow-up with hand specialist. Counseled patient on potential for adverse effects with medications prescribed/recommended today, ER and return-to-clinic precautions discussed, patient verbalized understanding.    Wallis Bamberg, PA-C 05/16/21 1316

## 2021-05-16 NOTE — ED Triage Notes (Signed)
Patient states that her ring finger on her right hand is swollen for the past 2 days  Patient states that when she uses that hand it swells afterwards  Patient states that she is not sure if she jammed it up  Patient states she took some ibuprofen last dose yesterday

## 2021-06-13 ENCOUNTER — Ambulatory Visit: Payer: Medicare Other | Admitting: Podiatry

## 2021-06-20 ENCOUNTER — Other Ambulatory Visit: Payer: Self-pay

## 2021-06-20 ENCOUNTER — Ambulatory Visit (INDEPENDENT_AMBULATORY_CARE_PROVIDER_SITE_OTHER): Payer: Medicare Other

## 2021-06-20 ENCOUNTER — Encounter: Payer: Self-pay | Admitting: Podiatry

## 2021-06-20 ENCOUNTER — Ambulatory Visit (INDEPENDENT_AMBULATORY_CARE_PROVIDER_SITE_OTHER): Payer: Medicare Other | Admitting: Podiatry

## 2021-06-20 DIAGNOSIS — M659 Synovitis and tenosynovitis, unspecified: Secondary | ICD-10-CM

## 2021-06-20 MED ORDER — MELOXICAM 15 MG PO TABS: ORAL_TABLET | ORAL | 1 refills | Status: DC

## 2021-06-20 NOTE — Progress Notes (Signed)
° °  Subjective:  51 y.o. female presenting today for evaluation of recurrent ankle pain to the left lower extremity.  She says that the cam boot has helped in the past however her husband threw it away.  She works on her feet throughout the day and she denies a history of injury.  Currently she has not done anything for treatment.  She presents for further treatment and evaluation   No past medical history on file.   Objective / Physical Exam:  General:  The patient is alert and oriented x3 in no acute distress. Dermatology:  Skin is warm, dry and supple bilateral lower extremities. Negative for open lesions or macerations. Vascular:  Palpable pedal pulses bilaterally. No edema or erythema noted. Capillary refill within normal limits. Neurological:  Epicritic and protective threshold grossly intact bilaterally.  Musculoskeletal Exam:  Pain on palpation to the anterior lateral medial aspects of the patient's left ankle. Mild edema noted. Range of motion within normal limits to all pedal and ankle joints bilateral. Muscle strength 5/5 in all groups bilateral.   Radiographic Exam:  Normal osseous mineralization. Joint spaces preserved. No fracture/dislocation/boney destruction.  Small possible os trigonum noted on lateral view  Assessment: 1.  Recurrent ankle pain left  Plan of Care:  1. Patient was evaluated. X-Rays reviewed.  2.  Patient declined any steroid injection today 3.  Cam boot dispensed.  Weightbearing as tolerated 4.  Prescription for meloxicam 15 mg daily 5.  Note for work was provided today 6.  Return to clinic as needed   Felecia Shelling, DPM [facility]  Dr. Felecia Shelling, DPM    [address]                                        [place], [place] [address]                Office [phone]  Fax [phone]

## 2021-06-20 NOTE — Progress Notes (Signed)
ankle

## 2021-08-08 ENCOUNTER — Ambulatory Visit
Admission: EM | Admit: 2021-08-08 | Discharge: 2021-08-08 | Disposition: A | Discharge status: Home / Self Care | Payer: Medicare Other | Attending: Urgent Care | Admitting: Urgent Care

## 2021-08-08 ENCOUNTER — Ambulatory Visit (INDEPENDENT_AMBULATORY_CARE_PROVIDER_SITE_OTHER): Payer: Medicare Other

## 2021-08-08 DIAGNOSIS — M25572 Pain in left ankle and joints of left foot: Secondary | ICD-10-CM

## 2021-08-08 DIAGNOSIS — Z9889 Other specified postprocedural states: Secondary | ICD-10-CM | POA: Diagnosis not present

## 2021-08-08 MED ORDER — IBUPROFEN 600 MG PO TABS: 600.0000 mg | ORAL_TABLET | Freq: Four times a day (QID) | ORAL | 0 refills | Status: DC | PRN

## 2021-08-08 NOTE — ED Provider Notes (Signed)
?[place] ? ? ?MRN: [id number] DOB: 1971-01-03 ? ?Subjective:  ? ?Sandy Phillips is a 51 y.o. female presenting for 1 day history of acute onset left ankle pain with swelling.  Patient has had a foot surgery in the left foot and reports that she easily gets ankle pain thereafter.  She does have an orthopedist that she follows up with.  Spends a lot of time walking and on her feet through her work.  Has not tried any medications for relief.  No particular fall, trauma to the area. ? ?No current facility-administered medications for this encounter. ? ?Current Outpatient Medications:  ?  albuterol (VENTOLIN HFA) 108 (90 Base) MCG/ACT inhaler, Inhale 1-2 puffs into the lungs every 6 (six) hours as needed for wheezing or shortness of breath., Disp: 18 g, Rfl: 0 ?  buPROPion (WELLBUTRIN XL) 150 MG 24 hr tablet, TAKE 1 TABLET(150 MG) BY MOUTH DAILY, Disp: , Rfl:  ?  buPROPion (WELLBUTRIN XL) 150 MG 24 hr tablet, Take 150 mg by mouth at bedtime., Disp: , Rfl:  ?  chlorhexidine (PERIDEX) 0.12 % solution, Use as directed 15 mLs in the mouth or throat 2 (two) times daily., Disp: 473 mL, Rfl: 0 ?  diclofenac Sodium (VOLTAREN) 1 % GEL, Apply 4 g topically 4 (four) times daily., Disp: 100 g, Rfl: 0 ?  erythromycin ophthalmic ointment, Place a 1/2 inch ribbon of ointment into the lower eyelid 4 times daily x 5 days, Disp: 3.5 g, Rfl: 0 ?  estradiol (ESTRACE) 0.1 MG/GM vaginal cream, 1 gram of cream vaginally every night for 2 weeks then 1 gram of cream vaginally twice a week thereafter., Disp: , Rfl:  ?  fluconazole (DIFLUCAN) 150 MG tablet, Take 1 tablet (150 mg total) by mouth daily., Disp: 1 tablet, Rfl: 1 ?  fluticasone (FLONASE) 50 MCG/ACT nasal spray, Place 1 spray into both nostrils daily for 14 days., Disp: 16 g, Rfl: 0 ?  gentamicin cream (GARAMYCIN) 0.1 %, APPLY EXTERNALLY TO THE AFFECTED AREA TWICE DAILY, Disp: , Rfl:  ?  hydrOXYzine (ATARAX/VISTARIL) 25 MG tablet, Take 25 mg by mouth 3 (three) times  daily., Disp: , Rfl:  ?  ipratropium (ATROVENT) 0.03 % nasal spray, Place 2 sprays into both nostrils 3 (three) times daily as needed for rhinitis., Disp: 30 mL, Rfl: 0 ?  Lidocaine (HM LIDOCAINE PATCH) 4 % PTCH, Apply 1 each topically 2 (two) times daily as needed (as needed for pain)., Disp: 30 patch, Rfl: 0 ?  meloxicam (MOBIC) 15 MG tablet, TAKE 1 TABLET(15 MG) BY MOUTH DAILY, Disp: 30 tablet, Rfl: 1 ?  olopatadine (PATANOL) 0.1 % ophthalmic solution, Place 1 drop into both eyes 2 (two) times daily., Disp: 5 mL, Rfl: 0  ? ?Allergies  ?Allergen Reactions  ? Celexa  [Citalopram Hydrobromide]   ?  Other reaction(s): Chest Pain  ? Penicillins Anaphylaxis and Rash  ? Bupropion Anxiety, Nausea Only and Palpitations  ? Marcaine  [Bupivacaine Hcl]   ?  Other reaction(s): Other ?blisters  ? Codeine Hives  ? Lidocaine   ?  Pt stated "Allergic to lidocaine injections in mouth, causes blisters in mouth"   ? Hydrocodone-Acetaminophen Rash  ? ? ?History reviewed. No pertinent past medical history.  ? ?Past Surgical History:  ?Procedure Laterality Date  ? ABDOMINAL HYSTERECTOMY    ? ABLATION    ? APPENDECTOMY    ? BREAST SURGERY    ? CARPAL TUNNEL RELEASE    ? FOOT SURGERY    ?  TUBAL LIGATION    ? ? ?Family History  ?Problem Relation Age of Onset  ? Cancer Mother   ? Heart disease Father   ? ? ?Social History  ? ?Tobacco Use  ? Smoking status: Former  ? Smokeless tobacco: Never  ?Vaping Use  ? Vaping Use: Never used  ?Substance Use Topics  ? Alcohol use: Never  ? Drug use: Never  ? ? ?ROS ? ? ?Objective:  ? ?Vitals: ?BP 110/77 (BP Location: Right Arm)   Pulse (!) 115   Temp 97.9 ?F (36.6 ?C) (Oral)   Resp 18   SpO2 95%  ? ?Physical Exam ?Constitutional:   ?   General: She is not in acute distress. ?   Appearance: Normal appearance. She is well-developed. She is not ill-appearing, toxic-appearing or diaphoretic.  ?HENT:  ?   Head: Normocephalic and atraumatic.  ?   Nose: Nose normal.  ?   Mouth/Throat:  ?   Mouth: Mucous  membranes are moist.  ?Eyes:  ?   General: No scleral icterus.    ?   Right eye: No discharge.     ?   Left eye: No discharge.  ?   Extraocular Movements: Extraocular movements intact.  ?Cardiovascular:  ?   Rate and Rhythm: Normal rate.  ?Pulmonary:  ?   Effort: Pulmonary effort is normal.  ?Musculoskeletal:  ?   Left ankle: Swelling present. No deformity, ecchymosis or lacerations. Tenderness present over the lateral malleolus and AITF ligament. No medial malleolus, ATF ligament, CF ligament, posterior TF ligament, base of 5th metatarsal or proximal fibula tenderness. Normal range of motion.  ?   Left Achilles Tendon: No tenderness or defects. Thompson's test negative.  ?Skin: ?   General: Skin is warm and dry.  ?Neurological:  ?   General: No focal deficit present.  ?   Mental Status: She is alert and oriented to person, place, and time.  ?Psychiatric:     ?   Mood and Affect: Mood normal.     ?   Behavior: Behavior normal.     ?   Thought Content: Thought content normal.     ?   Judgment: Judgment normal.  ? ? ?DG Ankle Complete Left ? ?Result Date: 08/08/2021 ?CLINICAL DATA:  Left ankle pain for 1 day.  No known injury. EXAM: LEFT ANKLE COMPLETE - 3+ VIEW COMPARISON:  06/20/2021 FINDINGS: The ankle mortise is symmetric and intact. Tiny plantar calcaneal heel spur. Joint spaces are preserved. No acute fracture or dislocation. IMPRESSION: No significant osteoarthritis. Electronically Signed   By: Yvonne Kendall M.D.   On: 08/08/2021 17:46   ? ?Left ankle wrapped using 4" Ace wrap in figure-8 method. ? ?Assessment and Plan :  ? ?PDMP not reviewed this encounter. ? ?1. Acute left ankle pain   ?2. History of foot surgery   ? ?Suspect inflammatory ankle pain.  Recommended follow-up with her orthopedist.  Ibuprofen, RICE method.  Counseled patient on potential for adverse effects with medications prescribed/recommended today, ER and return-to-clinic precautions discussed, patient verbalized understanding. ? ?  ?Jaynee Eagles, PA-C ?08/08/21 1813 ? ?

## 2021-08-08 NOTE — ED Triage Notes (Signed)
Pt reports pain in the left ankle;e x 1 day. Pain is worse when walking. Pt can not recall any injury. Ibuprofen gives some relief.  ?

## 2021-08-16 ENCOUNTER — Other Ambulatory Visit: Payer: Self-pay | Admitting: Podiatry

## 2021-08-22 ENCOUNTER — Encounter: Payer: Self-pay | Admitting: Podiatry

## 2021-08-22 ENCOUNTER — Ambulatory Visit (INDEPENDENT_AMBULATORY_CARE_PROVIDER_SITE_OTHER): Payer: Medicare Other | Admitting: Podiatry

## 2021-08-22 DIAGNOSIS — M7752 Other enthesopathy of left foot: Secondary | ICD-10-CM

## 2021-08-22 NOTE — Progress Notes (Signed)
? ?  Subjective:  ?51 y.o. female presenting today for follow-up evaluation of chronic left ankle pain.  Patient states that she continues to have pain and tenderness to her left ankle.  She currently wears an old ankle brace that she has.  She has tried the cam boot and received steroid injections in the past with no improvement. ? ?No past medical history on file. ?Past Surgical History:  ?Procedure Laterality Date  ? ABDOMINAL HYSTERECTOMY    ? ABLATION    ? APPENDECTOMY    ? BREAST SURGERY    ? CARPAL TUNNEL RELEASE    ? FOOT SURGERY    ? TUBAL LIGATION    ? ?Allergies  ?Allergen Reactions  ? Celexa  [Citalopram Hydrobromide]   ?  Other reaction(s): Chest Pain  ? Penicillins Anaphylaxis and Rash  ? Bupropion Anxiety, Nausea Only and Palpitations  ? Marcaine  [Bupivacaine Hcl]   ?  Other reaction(s): Other ?blisters  ? Codeine Hives  ? Lidocaine   ?  Pt stated "Allergic to lidocaine injections in mouth, causes blisters in mouth"   ? Hydrocodone-Acetaminophen Rash  ? ? ? ? ?Objective / Physical Exam:  ?General:  ?The patient is alert and oriented x3 in no acute distress. ?Dermatology:  ?Skin is warm, dry and supple bilateral lower extremities. Negative for open lesions or macerations. ?Vascular:  ?Palpable pedal pulses bilaterally. No edema or erythema noted. Capillary refill within normal limits. ?Neurological:  ?Epicritic and protective threshold grossly intact bilaterally.  ?Musculoskeletal Exam:  ?Pain on palpation to the anterior lateral medial aspects of the patient's left ankle. Mild edema noted. Range of motion within normal limits to all pedal and ankle joints bilateral. Muscle strength 5/5 in all groups bilateral.  ? ?Radiographic Exam LT ankle 08/08/2021:  ?FINDINGS: ?The ankle mortise is symmetric and intact. Tiny plantar calcaneal ?heel spur. Joint spaces are preserved. No acute fracture or ?dislocation. ?  ?IMPRESSION: ?No significant osteoarthritis. ? ?Assessment: ?1.  Recurrent chronic ankle pain  left ? ?Plan of Care:  ?1. Patient was evaluated.  ?2.  The patient has now failed multiple conservative modalities including steroid injections, oral anti-inflammatories, immobilization in the cam boot, and an ankle brace.  The patient has now had this pain for about 4-5 years now. ?3.  MRI ordered LT ankle ?4.  Ankle brace dispensed.  Wear daily in the meantime ?5.  Patient declined steroid injection or anti-inflammatory pills.  She has not had good success with these in the past ?6.  Return to clinic after MRI to review results and discuss further treatment options ? ? ?Felecia Shelling, DPM ?[facility] ? ?Dr. Felecia Shelling, DPM  ?  ?[address]                                        ?[place], [place] [address]                ?Office [phone]  ?Fax [phone] ? ? ? ? ? ? ?

## 2021-09-05 ENCOUNTER — Inpatient Hospital Stay: Admission: RE | Admit: 2021-09-05 | Payer: Medicare Other | Source: Ambulatory Visit

## 2021-09-08 ENCOUNTER — Telehealth: Payer: Self-pay | Admitting: *Deleted

## 2021-09-08 NOTE — Telephone Encounter (Signed)
Patient is calling because she missed her MRI appointment and cannot reach anyone @[facility]  to reschedule.  ?Called and scheduled the patient 09/09/21 @ 9:30,arrival time: 9:00 am.  ?The patient has been notified and given number to call if she has to reschedule. ?

## 2021-09-09 ENCOUNTER — Ambulatory Visit
Admission: RE | Admit: 2021-09-09 | Discharge: 2021-09-09 | Disposition: A | Discharge status: Home / Self Care | Payer: Medicare Other | Source: Ambulatory Visit | Attending: Podiatry | Admitting: Podiatry

## 2021-09-09 ENCOUNTER — Other Ambulatory Visit: Payer: Medicare Other

## 2021-09-09 DIAGNOSIS — M7752 Other enthesopathy of left foot: Secondary | ICD-10-CM

## 2021-09-19 ENCOUNTER — Encounter: Payer: Self-pay | Admitting: Podiatry

## 2021-09-19 ENCOUNTER — Ambulatory Visit (INDEPENDENT_AMBULATORY_CARE_PROVIDER_SITE_OTHER): Payer: Medicare Other | Admitting: Podiatry

## 2021-09-19 DIAGNOSIS — M25572 Pain in left ankle and joints of left foot: Secondary | ICD-10-CM

## 2021-09-19 NOTE — Progress Notes (Signed)
Subjective:  51 y.o. female presenting today for follow-up evaluation of chronic left ankle pain.  Patient states that she continues to have pain and tenderness to her left ankle.  Patient had MRI performed on 09/09/2021.  She presents today to review the MRI results and discuss treatment options.  She says that recently her ankle has not been hurting so bad.  No past medical history on file. Past Surgical History:  Procedure Laterality Date   ABDOMINAL HYSTERECTOMY     ABLATION     APPENDECTOMY     BREAST SURGERY     CARPAL TUNNEL RELEASE     FOOT SURGERY     TUBAL LIGATION     Allergies  Allergen Reactions   Celexa  [Citalopram Hydrobromide]     Other reaction(s): Chest Pain   Penicillins Anaphylaxis and Rash   Bupropion Anxiety, Nausea Only and Palpitations   Marcaine  [Bupivacaine Hcl]     Other reaction(s): Other blisters   Codeine Hives   Lidocaine     Pt stated "Allergic to lidocaine injections in mouth, causes blisters in mouth"    Hydrocodone-Acetaminophen Rash   Objective / Physical Exam:  General:  The patient is alert and oriented x3 in no acute distress. Dermatology:  Skin is warm, dry and supple bilateral lower extremities. Negative for open lesions or macerations. Vascular:  Palpable pedal pulses bilaterally. No edema or erythema noted. Capillary refill within normal limits. Neurological:  Epicritic and protective threshold grossly intact bilaterally.  Musculoskeletal Exam:  Pain on palpation to the anterior lateral medial aspects of the patient's left ankle. Mild edema noted. Range of motion within normal limits to all pedal and ankle joints bilateral. Muscle strength 5/5 in all groups bilateral.   Radiographic Exam LT ankle 08/08/2021:  FINDINGS: The ankle mortise is symmetric and intact. Tiny plantar calcaneal heel spur. Joint spaces are preserved. No acute fracture or dislocation. IMPRESSION: No significant osteoarthritis.  MR ANKLE LT WO CONTRAST  09/09/2021: Bones: No acute fracture. Pes planovalgus alignment. 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 No marrow signal abnormality. No suspicious bone lesion. Other: 10 x 5 x 9 mm cyst adjacent to the lateral margin of the posterior subtalar joint (series 4, image 17). IMPRESSION: 1. No acute osseous abnormality of the left ankle. 2. Soft tissue edema within the sinus tarsi with loss of the anatomic fat signal. Findings can be seen in the setting of sinus tarsi syndrome. 3. Pes planovalgus alignment. 4. Trace peroneus brevis tenosynovitis. 5. Small ganglion cyst adjacent to the posterior subtalar joint.  Assessment: 1.  Recurrent chronic ankle pain left 2.  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 Patient was evaluated.  MRI findings were reviewed with the patient. 2.  Currently the patient does not want any intervention or treatment for the left ankle or foot.  She declined treatment such as oral anti-inflammatories, steroid injection, additional bracing. 3.  Continue wearing good supportive shoes and sneakers 4.  Return to clinic as needed  Felecia Shelling, DPM [facility]  Dr. Felecia Shelling, DPM    [address], [place] [address]  Office [phone]  Fax [phone]

## 2021-10-04 ENCOUNTER — Ambulatory Visit
Admission: RE | Admit: 2021-10-04 | Discharge: 2021-10-04 | Disposition: A | Discharge status: Home / Self Care | Payer: Medicare Other | Source: Ambulatory Visit | Attending: Nurse Practitioner | Admitting: Nurse Practitioner

## 2021-10-04 VITALS — BP 101/72 | HR 93 | Temp 97.9°F | Resp 18

## 2021-10-04 DIAGNOSIS — M25561 Pain in right knee: Secondary | ICD-10-CM

## 2021-10-04 DIAGNOSIS — M25521 Pain in right elbow: Secondary | ICD-10-CM

## 2021-10-04 MED ORDER — IBUPROFEN 800 MG PO TABS: 800.0000 mg | ORAL_TABLET | Freq: Three times a day (TID) | ORAL | 0 refills | Status: AC | PRN

## 2021-10-04 MED ORDER — DICLOFENAC SODIUM 1 % EX GEL
4.0000 g | Freq: Four times a day (QID) | CUTANEOUS | 0 refills | Status: AC
Start: 2021-10-04 — End: ?

## 2021-10-04 NOTE — ED Provider Notes (Signed)
[facility]    CSN: [id number] Arrival date & time: 10/04/21  1047      History   Chief Complaint Chief Complaint  Patient presents with   Knee Injury    Entered by patient    HPI Sandy Phillips is a 51 y.o. female.   Patient presents for right sided knee pain that she rates as an 8 out of 10 when she is walking or going up and down stairs.  She denies any recent fall, accident, or injury, but felt a pop last week when she was walking.  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  Reports the pain in her knee is all over the knee, mostly at the bottom.  The pain does not radiate down to her ankle or upper thigh.  She is taking ibuprofen which does not help.  She denies any weakness with weightbearing, sensation of giving way, locking, popping, bruising, or swelling.  There is no redness.  She denies any numbness or tingling in her toes, and fevers or nausea/vomiting since the pain started.   History reviewed. No pertinent past medical history.  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      Home Medications    Prior to Admission medications   Medication Sig Start Date End Date Taking? Authorizing Provider  ibuprofen (ADVIL) 800 MG tablet Take 1 tablet (800 mg total) by mouth every 8 (eight) hours as needed. Take with food to prevent GI upset 10/04/21  Yes Noemi Chapel A, NP  albuterol (VENTOLIN HFA) 108 (90 Base) MCG/ACT inhaler Inhale 1-2 puffs into the lungs every 6 (six) hours as needed for wheezing or shortness  of breath. 10/13/19   Avegno, Darrelyn Hillock, FNP  buPROPion (WELLBUTRIN XL) 150 MG 24 hr tablet TAKE 1 TABLET(150 MG) BY MOUTH DAILY 11/14/19   [provider]  buPROPion (WELLBUTRIN XL) 150 MG 24 hr tablet Take 150 mg by mouth at bedtime. 11/14/19   [provider]  chlorhexidine (PERIDEX) 0.12 % solution Use as directed 15 mLs in the mouth or throat 2 (two) times daily. 07/10/19   Wurst, [place], PA-C  diclofenac Sodium (VOLTAREN) 1 % GEL Apply 4 g topically 4 (four) times daily. 10/04/21   Eulogio Bear, NP  erythromycin ophthalmic ointment Place a 1/2 inch ribbon of ointment into the lower eyelid 4 times daily x 5 days 12/07/20   Wieters, Hallie C, PA-C  estradiol (ESTRACE) 0.1 MG/GM vaginal cream 1 gram of cream vaginally every night for 2 weeks then 1 gram of cream vaginally twice a week thereafter. 12/26/19   [provider]  fluconazole (DIFLUCAN) 150 MG tablet Take 1 tablet (150 mg total) by mouth daily. 06/23/20   Fransico Meadow, PA-C  fluticasone (FLONASE) 50 MCG/ACT nasal spray Place 1 spray into both nostrils daily for 14 days. 03/01/20 03/15/20  Avegno, Darrelyn Hillock, FNP  gentamicin cream (GARAMYCIN) 0.1 %  APPLY EXTERNALLY TO THE AFFECTED AREA TWICE DAILY 07/10/19   [provider]  hydrOXYzine (ATARAX/VISTARIL) 25 MG tablet Take 25 mg by mouth 3 (three) times daily. 10/17/19   [provider]  ipratropium (ATROVENT) 0.03 % nasal spray Place 2 sprays into both nostrils 3 (three) times daily as needed for rhinitis. 09/13/20   Scot Jun, FNP  Lidocaine (HM LIDOCAINE PATCH) 4 % PTCH Apply 1 each topically 2 (two) times daily as needed (as needed for pain). 04/28/20   Faustino Congress, NP  olopatadine (PATANOL) 0.1 % ophthalmic solution Place 1 drop into both eyes 2 (two) times daily. 12/07/20   Wieters, Hallie C, PA-C  sertraline (ZOLOFT) 50 MG tablet Take by mouth. 01/29/17 07/10/19  [provider]  zolpidem (AMBIEN) 5 MG tablet Take 1  tablet (5 mg total) by mouth at bedtime as needed for sleep. 01/09/19 07/10/19  Edrick Kins, DPM    Family History Family History  Problem Relation Age of Onset   Cancer Mother    Heart disease Father     Social History Social History   Tobacco Use   Smoking status: Every Day    Types: Cigarettes   Smokeless tobacco: Never  Vaping Use   Vaping Use: Never used  Substance Use Topics   Alcohol use: Never   Drug use: Never     Allergies   Celexa  [citalopram hydrobromide], Penicillins, Bupropion, Marcaine  [bupivacaine hcl], Codeine, Lidocaine, and Hydrocodone-acetaminophen   Review of Systems Review of Systems Per HPI  Physical Exam Triage Vital Signs ED Triage Vitals  Enc Vitals Group     BP 10/04/21 1055 101/72     Pulse Rate 10/04/21 1055 93     Resp 10/04/21 1055 18     Temp 10/04/21 1055 97.9 F (36.6 C)     Temp Source 10/04/21 1055 Oral     SpO2 10/04/21 1055 94 %     Weight --      Height --      Head Circumference --      Peak Flow --      Pain Score 10/04/21 1057 8     Pain Loc --      Pain Edu? --      Excl. in [place]? --    No data found.  Updated Vital Signs BP 101/72 (BP Location: Right Arm)   Pulse 93   Temp 97.9 F (36.6 C) (Oral)   Resp 18   SpO2 94%   Visual Acuity Right Eye Distance:   Left Eye Distance:   Bilateral Distance:    Right Eye Near:   Left Eye Near:    Bilateral Near:     Physical Exam Vitals and nursing note reviewed.  Constitutional:      General: She is not in acute distress.    Appearance: Normal appearance. She is not toxic-appearing.  HENT:     Head: Normocephalic and atraumatic.  Pulmonary:     Effort: Pulmonary effort is normal. No respiratory distress.  Musculoskeletal:        General: Tenderness present.     Right knee: No swelling, deformity, effusion, erythema or bony tenderness. Normal range of motion. Tenderness present over the lateral joint line.     Left knee: Normal.     Right lower leg: No  edema.     Left lower leg: No edema.     Right ankle: Normal. Normal pulse.     Left ankle: Normal. Normal  pulse.     Comments: No obvious erythema, swelling, bruising, deformity to right knee.  Knee is tender to palpation along the lateral joint line and most tender inferior to the patella.  Laxity testing difficult to perform secondary to pain  Skin:    General: Skin is warm and dry.     Capillary Refill: Capillary refill takes less than 2 seconds.     Coloration: Skin is not jaundiced or pale.     Findings: No erythema.  Neurological:     Mental Status: She is alert and oriented to person, place, and time.     Motor: No weakness.     Gait: Gait normal.  Psychiatric:        Behavior: Behavior is cooperative.     UC Treatments / Results  Labs (all labs ordered are listed, but only abnormal results are displayed) Labs Reviewed - No data to display  EKG   Radiology No results found.  Procedures Procedures (including critical care time)  Medications Ordered in UC Medications - No data to display  Initial Impression / Assessment and Plan / UC Course  I have reviewed the triage vital signs and the nursing notes.  Pertinent labs & imaging results that were available during my care of the patient were reviewed by me and considered in my medical decision making (see chart for details).    Knee pain is consistent with strain of knee.  Discussed supportive care; use of Tylenol alternating with ibuprofen, diclofenac gel, ice, and rest.  Note given for work.  Encouraged close follow up with Orthopedic provider if symptoms persist or worsen despite treatment; contact information given. Final Clinical Impressions(s) / UC Diagnoses   Final diagnoses:  Acute pain of right knee     Discharge Instructions      - Please start alternating Tylenol and ibuprofen for the knee pain - The knee pain should improve over the next few weeks; if it does not, please follow up with the  Orthopedist; contact information is below    ED Prescriptions     Medication Sig Dispense Auth. Provider   diclofenac Sodium (VOLTAREN) 1 % GEL Apply 4 g topically 4 (four) times daily. 100 g Noemi Chapel A, NP   ibuprofen (ADVIL) 800 MG tablet Take 1 tablet (800 mg total) by mouth every 8 (eight) hours as needed. Take with food to prevent GI upset 21 tablet Eulogio Bear, NP      PDMP not reviewed this encounter.   Eulogio Bear, NP 10/04/21 1301

## 2021-10-04 NOTE — ED Triage Notes (Signed)
Pt states that last week she right knee started hurting  Pt states it feels like she pulled something in her knee cap  Pt states she tried Ibuprofen without any relief

## 2021-10-04 NOTE — Discharge Instructions (Addendum)
-   Please start alternating Tylenol and ibuprofen for the knee pain - The knee pain should improve over the next few weeks; if it does not, please follow up with the Orthopedist; contact information is below

## 2021-10-17 ENCOUNTER — Telehealth: Payer: Self-pay | Admitting: *Deleted

## 2021-11-18 ENCOUNTER — Encounter (HOSPITAL_COMMUNITY): Payer: Self-pay | Admitting: Emergency Medicine

## 2021-11-18 ENCOUNTER — Emergency Department (HOSPITAL_COMMUNITY)
Admission: EM | Admit: 2021-11-18 | Discharge: 2021-11-18 | Disposition: A | Discharge status: Home / Self Care | Payer: Medicare Other | Attending: Emergency Medicine | Admitting: Emergency Medicine

## 2021-11-18 DIAGNOSIS — K0889 Other specified disorders of teeth and supporting structures: Secondary | ICD-10-CM | POA: Insufficient documentation

## 2021-11-18 DIAGNOSIS — R220 Localized swelling, mass and lump, head: Secondary | ICD-10-CM | POA: Diagnosis present

## 2021-11-18 MED ORDER — HYDROCODONE-ACETAMINOPHEN 5-325 MG PO TABS: ORAL_TABLET | ORAL | 0 refills | Status: AC

## 2021-11-18 MED ORDER — CLINDAMYCIN HCL 300 MG PO CAPS: 300.0000 mg | ORAL_CAPSULE | Freq: Three times a day (TID) | ORAL | 0 refills | Status: AC

## 2021-11-18 NOTE — ED Provider Notes (Signed)
[facility] EMERGENCY DEPARTMENT Provider Note   CSN: [id number] Arrival date & time: 11/18/21  1041     History {Add pertinent medical, surgical, social history, OB history to HPI:1} Chief Complaint  Patient presents with   Dental Pain    Sandy Phillips is a 51 y.o. female.   Dental Pain Associated symptoms: facial swelling   Associated symptoms: no fever and no headaches        Sandy Phillips is a 51 y.o. female who presents to the Emergency Department complaining of pain and swelling of the left lower gums.  States that she had all her lower teeth extracted 8 days ago.  She has noticed some pain and drainage at the area of the left lateral gums.  Pain is radiating into her face and toward her left ear.  She denies any neck pain, difficulty swallowing fever or chills.  She feels as though her left face is swollen.   Home Medications Prior to Admission medications   Medication Sig Start Date End Date Taking? Authorizing Provider  albuterol (VENTOLIN HFA) 108 (90 Base) MCG/ACT inhaler Inhale 1-2 puffs into the lungs every 6 (six) hours as needed for wheezing or shortness of breath. 10/13/19   Avegno, Zachery Dakins, FNP  buPROPion (WELLBUTRIN XL) 150 MG 24 hr tablet TAKE 1 TABLET(150 MG) BY MOUTH DAILY 11/14/19   [provider]  buPROPion (WELLBUTRIN XL) 150 MG 24 hr tablet Take 150 mg by mouth at bedtime. 11/14/19   [provider]  chlorhexidine (PERIDEX) 0.12 % solution Use as directed 15 mLs in the mouth or throat 2 (two) times daily. 07/10/19   Wurst, [place], PA-C  diclofenac Sodium (VOLTAREN) 1 % GEL Apply 4 g topically 4 (four) times daily. 10/04/21   Valentino Nose, NP  erythromycin ophthalmic ointment Place a 1/2 inch ribbon of ointment into the lower eyelid 4 times daily x 5 days 12/07/20   Wieters, Hallie C, PA-C  estradiol (ESTRACE) 0.1 MG/GM vaginal cream 1 gram of cream vaginally every night for 2 weeks then 1 gram of cream vaginally twice a week thereafter.  12/26/19   [provider]  fluconazole (DIFLUCAN) 150 MG tablet Take 1 tablet (150 mg total) by mouth daily. 06/23/20   Elson Areas, PA-C  fluticasone (FLONASE) 50 MCG/ACT nasal spray Place 1 spray into both nostrils daily for 14 days. 03/01/20 03/15/20  Avegno, Zachery Dakins, FNP  gentamicin cream (GARAMYCIN) 0.1 % APPLY EXTERNALLY TO THE AFFECTED AREA TWICE DAILY 07/10/19   [provider]  hydrOXYzine (ATARAX/VISTARIL) 25 MG tablet Take 25 mg by mouth 3 (three) times daily. 10/17/19   [provider]  ibuprofen (ADVIL) 800 MG tablet Take 1 tablet (800 mg total) by mouth every 8 (eight) hours as needed. Take with food to prevent GI upset 10/04/21   Cathlean Marseilles A, NP  ipratropium (ATROVENT) 0.03 % nasal spray Place 2 sprays into both nostrils 3 (three) times daily as needed for rhinitis. 09/13/20   Bing Neighbors, FNP  Lidocaine (HM LIDOCAINE PATCH) 4 % PTCH Apply 1 each topically 2 (two) times daily as needed (as needed for pain). 04/28/20   Moshe Cipro, NP  olopatadine (PATANOL) 0.1 % ophthalmic solution Place 1 drop into both eyes 2 (two) times daily. 12/07/20   Wieters, Hallie C, PA-C  sertraline (ZOLOFT) 50 MG tablet Take by mouth. 01/29/17 07/10/19  [provider]  zolpidem (AMBIEN) 5 MG tablet Take 1 tablet (5 mg total) by mouth at bedtime  as needed for sleep. 01/09/19 07/10/19  Felecia Shelling, DPM      Allergies    Celexa  [citalopram hydrobromide], Penicillins, Bupropion, Marcaine  [bupivacaine hcl], Codeine, Lidocaine, and Hydrocodone-acetaminophen    Review of Systems   Review of Systems  Constitutional:  Negative for chills and fever.  HENT:  Positive for dental problem and facial swelling. Negative for sore throat and trouble swallowing.   Respiratory:  Negative for cough, shortness of breath and wheezing.   Cardiovascular:  Negative for chest pain.  Gastrointestinal:  Negative for nausea and vomiting.  Neurological:  Negative for  dizziness, syncope, facial asymmetry, weakness and headaches.    Physical Exam Updated Vital Signs BP 104/69 (BP Location: Right Arm)   Pulse 86   Temp 98 F (36.7 C) (Oral)   Resp 18   Ht 5\' 2"  (1.575 m)   Wt 75.9 kg   SpO2 93%   BMI 30.62 kg/m  Physical Exam Vitals and nursing note reviewed.  Constitutional:      General: She is not in acute distress.    Appearance: Normal appearance. She is not toxic-appearing.  HENT:     Head:     Comments: No appreciable facial edema.    Right Ear: Tympanic membrane and ear canal normal.     Left Ear: Tympanic membrane and ear canal normal.     Mouth/Throat:     Mouth: Mucous membranes are moist.     Pharynx: Oropharynx is clear.     Comments: Patient is edentulous.  Healing incision of the lower gumline.  There is some mild erythema and edema noted of the lateral lower gums.  No active drainage or fluctuance.  Uvula midline nonedematous.  No sublingual abnormalities, no trismus Cardiovascular:     Rate and Rhythm: Normal rate and regular rhythm.     Pulses: Normal pulses.  Pulmonary:     Effort: Pulmonary effort is normal.     Breath sounds: Normal breath sounds.  Musculoskeletal:     Cervical back: Normal range of motion. No tenderness. No spinous process tenderness or muscular tenderness.  Lymphadenopathy:     Cervical: No cervical adenopathy.  Skin:    General: Skin is warm.  Neurological:     Mental Status: She is alert.     ED Results / Procedures / Treatments   Labs (all labs ordered are listed, but only abnormal results are displayed) Labs Reviewed - No data to display  EKG None  Radiology No results found.  Procedures Procedures  {Document cardiac monitor, telemetry assessment procedure when appropriate:1}  Medications Ordered in ED Medications - No data to display  ED Course/ Medical Decision Making/ A&P                           Medical Decision Making Patient here with complaints of gum pain and  swelling she is 1 week status post multiple dental extractions.  No trismus or appreciable facial edema.  She is handling secretions well.  No drainage or fluctuance of the gumline.  Symptoms likely inflammatory, but may also have a developing infection.  Overall, patient well-appearing nontoxic.  Will provide short course of pain medication and antibiotic.  She will follow-up next week with her dentist.  Return precautions discussed.     {Document critical care time when appropriate:1} {Document review of labs and clinical decision tools ie heart score, Chads2Vasc2 etc:1}  {Document your independent review of radiology images, and  any outside records:1} {Document your discussion with family members, caretakers, and with consultants:1} {Document social determinants of health affecting pt's care:1} {Document your decision making why or why not admission, treatments were needed:1} Final Clinical Impression(s) / ED Diagnoses Final diagnoses:  None    Rx / DC Orders ED Discharge Orders     None

## 2021-11-18 NOTE — Discharge Instructions (Signed)
Continue warm salt water rinses to your mouth.  Take the antibiotic as directed until finished.  Be sure to follow-up next week with your dentist.

## 2021-11-18 NOTE — ED Triage Notes (Signed)
Had all teeth extracted last Thursday. something sticking out the bottom LT side of gum that is painful that started this week.

## 2022-02-03 NOTE — Telephone Encounter (Signed)
Error message

## 2022-02-06 ENCOUNTER — Ambulatory Visit
Admission: EM | Admit: 2022-02-06 | Discharge: 2022-02-06 | Disposition: A | Discharge status: Home / Self Care | Payer: Medicare Other

## 2022-02-06 DIAGNOSIS — B9789 Other viral agents as the cause of diseases classified elsewhere: Secondary | ICD-10-CM

## 2022-02-06 DIAGNOSIS — J329 Chronic sinusitis, unspecified: Secondary | ICD-10-CM

## 2022-02-06 MED ORDER — FLUTICASONE PROPIONATE 50 MCG/ACT NA SUSP: 1.0000 | Freq: Two times a day (BID) | NASAL | 2 refills | Status: AC

## 2022-02-06 MED ORDER — PSEUDOEPHEDRINE HCL ER 120 MG PO TB12: 120.0000 mg | ORAL_TABLET | Freq: Two times a day (BID) | ORAL | 0 refills | Status: AC | PRN

## 2022-02-06 MED ORDER — CETIRIZINE HCL 10 MG PO TABS: 10.0000 mg | ORAL_TABLET | Freq: Every day | ORAL | 2 refills | Status: AC

## 2022-02-06 NOTE — ED Triage Notes (Signed)
Pt reports watery eyes, sneezing, sinus pressure, nasal congestion x 4 days, Mucinex gives some relief. Pt reports she do not have COVID.

## 2022-02-06 NOTE — ED Provider Notes (Signed)
[facility]    CSN: [id number] Arrival date & time: 02/06/22  1543      History   Chief Complaint Chief Complaint  Patient presents with   Nasal Congestion    HPI Sandy Phillips is a 51 y.o. female.   Presenting today with 4-day history of itchy watery eyes, sneezing, sinus pressure, nasal congestion, postnasal drip, cough.  Denies fever, chills, chest pain, shortness of breath, abdominal pain, nausea vomiting or diarrhea.  So far taking Mucinex with mild temporary relief of symptoms.  Took a COVID test right after symptoms started and states this was negative.  No known sick contacts recently.  States she has a history of "bad sinuses "but does not take anything for seasonal allergies.    History reviewed. No pertinent past medical history.  Patient Active Problem List   Diagnosis Date Noted   Tobacco use 12/29/2018   Anxiety 06/25/2014   Bipolar disorder ([place]) 06/25/2014   Low back pain 06/25/2014   S/P mastectomy, bilateral 02/26/2014   Internal hemorrhoids 04/18/2012    Past Surgical History:  Procedure Laterality Date   ABDOMINAL HYSTERECTOMY     ABLATION     APPENDECTOMY     BREAST SURGERY     CARPAL TUNNEL RELEASE     FOOT SURGERY     TUBAL LIGATION      OB History   No obstetric history on file.      Home Medications    Prior to Admission medications   Medication Sig Start Date End Date Taking? Authorizing Provider  cetirizine (ZYRTEC ALLERGY) 10 MG tablet Take 1 tablet (10 mg total) by mouth daily. 02/06/22  Yes Volney American, PA-C  fluticasone [facility]) 50 MCG/ACT nasal spray Place 1 spray into both nostrils 2 (two) times daily. 02/06/22  Yes Volney American, PA-C  pseudoephedrine (SUDAFED 12 HOUR) 120 MG 12 hr tablet Take 1 tablet (120 mg total) by mouth every 12 (twelve) hours as needed for congestion. 02/06/22  Yes Volney American, PA-C  Pseudoephedrine-guaiFENesin [facility] D PO) Take by mouth.   Yes [provider]  albuterol (VENTOLIN HFA) 108 (90 Base) MCG/ACT inhaler Inhale 1-2 puffs into the lungs every 6 (six) hours as needed for wheezing or shortness of breath. 10/13/19   Avegno, Darrelyn Hillock, FNP  buPROPion (WELLBUTRIN XL) 150 MG 24 hr tablet TAKE 1 TABLET(150 MG) BY MOUTH DAILY 11/14/19   [provider]  buPROPion (WELLBUTRIN XL) 150 MG 24 hr tablet Take 150 mg by mouth at bedtime. 11/14/19   [provider]  chlorhexidine (PERIDEX) 0.12 % solution Use as directed 15 mLs in the mouth or throat 2 (two) times daily. 07/10/19   Wurst, [place], PA-C  clindamycin (CLEOCIN) 300 MG capsule Take 1 capsule (300 mg total) by mouth 3 (three) times daily. 11/18/21   Triplett, [place]ass[place], PA-C  diclofenac Sodium (VOLTAREN) 1 % GEL Apply 4 g topically 4 (four) times daily. 10/04/21   Eulogio Bear, NP  erythromycin ophthalmic ointment Place a 1/2 inch ribbon of ointment into the lower eyelid 4 times daily x 5 days 12/07/20   Wieters, Hallie C, PA-C  estradiol (ESTRACE) 0.1 MG/GM vaginal cream 1 gram of cream vaginally every night for 2 weeks then 1 gram of cream vaginally twice a week thereafter. 12/26/19   [provider]  fluconazole (DIFLUCAN) 150 MG tablet Take 1 tablet (150 mg total) by mouth daily. 06/23/20   Fransico Meadow, PA-C  fluticasone (FLONASE) 50  MCG/ACT nasal spray Place 1 spray into both nostrils daily for 14 days. 03/01/20 03/15/20  Avegno, Zachery Dakins, FNP  gentamicin cream (GARAMYCIN) 0.1 % APPLY EXTERNALLY TO THE AFFECTED AREA TWICE DAILY 07/10/19   [provider]  HYDROcodone-acetaminophen (NORCO/VICODIN) 5-325 MG tablet Take one tab po q 4 hrs prn pain 11/18/21   Triplett, Alexias, PA-C  hydrOXYzine (ATARAX/VISTARIL) 25 MG tablet Take 25 mg by mouth 3 (three) times daily. 10/17/19   [provider]  ibuprofen (ADVIL) 800 MG tablet Take 1 tablet (800 mg total) by mouth every 8 (eight) hours as needed. Take with food to prevent GI upset 10/04/21    Cathlean Marseilles A, NP  ipratropium (ATROVENT) 0.03 % nasal spray Place 2 sprays into both nostrils 3 (three) times daily as needed for rhinitis. 09/13/20   Bing Neighbors, FNP  Lidocaine (HM LIDOCAINE PATCH) 4 % PTCH Apply 1 each topically 2 (two) times daily as needed (as needed for pain). 04/28/20   Moshe Cipro, NP  olopatadine (PATANOL) 0.1 % ophthalmic solution Place 1 drop into both eyes 2 (two) times daily. 12/07/20   Wieters, Hallie C, PA-C  sertraline (ZOLOFT) 50 MG tablet Take by mouth. 01/29/17 07/10/19  [provider]  zolpidem (AMBIEN) 5 MG tablet Take 1 tablet (5 mg total) by mouth at bedtime as needed for sleep. 01/09/19 07/10/19  Felecia Shelling, DPM    Family History Family History  Problem Relation Age of Onset   Cancer Mother    Heart disease Father     Social History Social History   Tobacco Use   Smoking status: Former    Types: Cigarettes   Smokeless tobacco: Never  Vaping Use   Vaping Use: Never used  Substance Use Topics   Alcohol use: Never   Drug use: Never     Allergies   Celexa  [citalopram hydrobromide], Penicillins, Bupropion, Marcaine  [bupivacaine hcl], Codeine, Lidocaine, and Hydrocodone-acetaminophen   Review of Systems Review of Systems Per HPI  Physical Exam Triage Vital Signs ED Triage Vitals  Enc Vitals Group     BP 02/06/22 1709 100/69     Pulse Rate 02/06/22 1709 83     Resp 02/06/22 1709 18     Temp 02/06/22 1709 98 F (36.7 C)     Temp Source 02/06/22 1709 Oral     SpO2 02/06/22 1709 96 %     Weight --      Height --      Head Circumference --      Peak Flow --      Pain Score 02/06/22 1707 0     Pain Loc --      Pain Edu? --      Excl. in GC? --    No data found.  Updated Vital Signs BP 100/69 (BP Location: Right Arm)   Pulse 83   Temp 98 F (36.7 C) (Oral)   Resp 18   SpO2 96%   Visual Acuity Right Eye Distance:   Left Eye Distance:   Bilateral Distance:    Right Eye Near:   Left Eye  Near:    Bilateral Near:     Physical Exam Vitals and nursing note reviewed.  Constitutional:      Appearance: Normal appearance.  HENT:     Head: Atraumatic.     Right Ear: Tympanic membrane and external ear normal.     Left Ear: Tympanic membrane and external ear normal.     Nose:  Rhinorrhea present.     Mouth/Throat:     Mouth: Mucous membranes are moist.     Pharynx: Posterior oropharyngeal erythema present.  Eyes:     Extraocular Movements: Extraocular movements intact.     Conjunctiva/sclera: Conjunctivae normal.  Cardiovascular:     Rate and Rhythm: Normal rate and regular rhythm.     Heart sounds: Normal heart sounds.  Pulmonary:     Effort: Pulmonary effort is normal.     Breath sounds: Normal breath sounds. No wheezing or rales.  Musculoskeletal:        General: Normal range of motion.     Cervical back: Normal range of motion and neck supple.  Skin:    General: Skin is warm and dry.  Neurological:     Mental Status: She is alert and oriented to person, place, and time.  Psychiatric:        Mood and Affect: Mood normal.        Thought Content: Thought content normal.      UC Treatments / Results  Labs (all labs ordered are listed, but only abnormal results are displayed) Labs Reviewed - No data to display  EKG   Radiology No results found.  Procedures Procedures (including critical care time)  Medications Ordered in UC Medications - No data to display  Initial Impression / Assessment and Plan / UC Course  I have reviewed the triage vital signs and the nursing notes.  Pertinent labs & imaging results that were available during my care of the patient were reviewed by me and considered in my medical decision making (see chart for details).     Vital signs and exam overall reassuring and suspicious for viral versus allergic sinusitis.  We will start good allergy regimen with Zyrtec and Flonase consistently, Sudafed, sinus rinses and other supportive  measures additionally to help with symptoms.  Declines viral testing today as home COVID test was negative several days ago.  Work note given.  Return for worsening symptoms.  Final Clinical Impressions(s) / UC Diagnoses   Final diagnoses:  Viral sinusitis   Discharge Instructions   None    ED Prescriptions     Medication Sig Dispense Auth. Provider   cetirizine (ZYRTEC ALLERGY) 10 MG tablet Take 1 tablet (10 mg total) by mouth daily. 30 tablet Particia Nearing, PA-C   fluticasone [facility] [place]) 50 MCG/ACT nasal spray Place 1 spray into both nostrils 2 (two) times daily. 16 g Particia Nearing, [place]   pseudoephedrine (SUDAFED 12 HOUR) 120 MG 12 hr tablet Take 1 tablet (120 mg total) by mouth every 12 (twelve) hours as needed for congestion. 20 tablet Particia Nearing, [place]      PDMP not reviewed this encounter.   Particia Nearing, [place] 02/06/22 1842

## 2022-05-06 ENCOUNTER — Other Ambulatory Visit: Payer: Self-pay | Admitting: Family Medicine

## 2022-05-08 NOTE — Telephone Encounter (Signed)
Requested Prescriptions  Pending Prescriptions Disp Refills   fluticasone (FLONASE) 50 MCG/ACT nasal spray [Pharmacy Med Name: FLUTICASONE 50MCG NASAL SP (120) RX] 16 g 2    Sig: SHAKE LIQUID AND USE 1 SPRAY IN EACH NOSTRIL TWICE DAILY     There is no refill protocol information for this order      

## 2022-06-29 ENCOUNTER — Encounter: Payer: Self-pay | Admitting: Radiology

## 2024-02-20 IMAGING — MR MR ANKLE*L* W/O CM
5 series · 40 of 40 positions shown · non-contrast
Comparison: X-ray 08/08/2021

CLINICAL DATA: Lateral ankle pain and weakness for 2 years. No
known injury

EXAM:
MRI OF THE LEFT ANKLE WITHOUT CONTRAST
TECHNIQUE: Multiplanar, multisequence MR imaging of the ankle was performed. No
intravenous contrast was administered.

[Series 4: T2 fat-sat · axial · 3.0mm · 0.55mm/px · z∈[-96,+25]mm · 9 of 32 slices shown (1 of 2)]
[im 1/32]
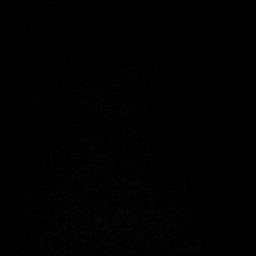
[im 4/32]
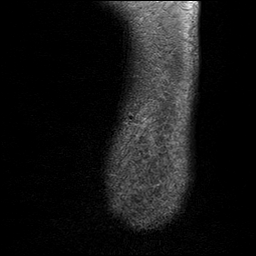
[im 8/32]
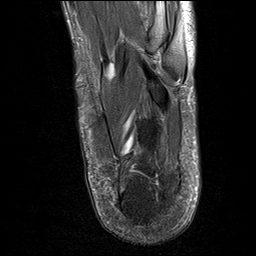
[im 12/32]
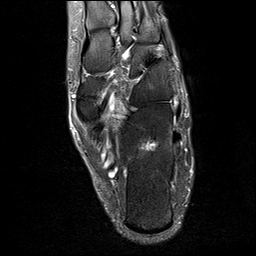
[im 16/32]
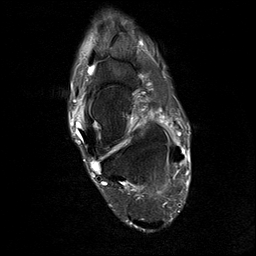
[im 20/32]
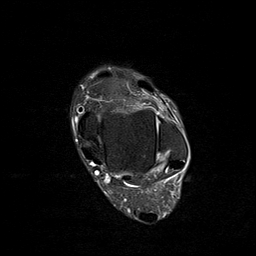
[im 24/32]
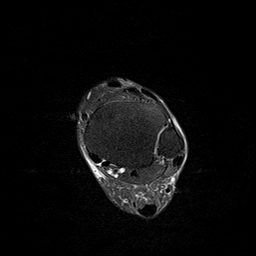
[im 28/32]
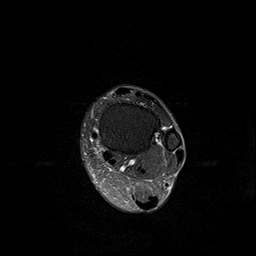
[im 32/32]
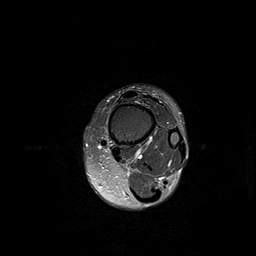

[Series 5: PD fat-sat · axial · 3.0mm · 0.55mm/px · z∈[-96,+25]mm · 9 of 32 slices shown]
[im 1/32]
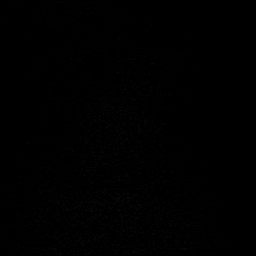
[im 4/32]
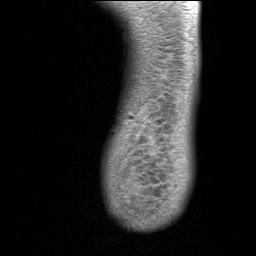
[im 8/32]
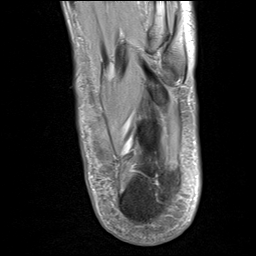
[im 12/32]
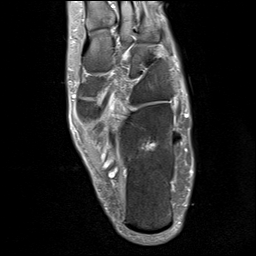
[im 16/32]
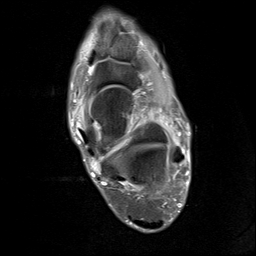
[im 20/32]
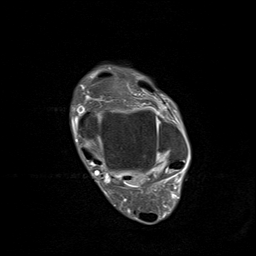
[im 24/32]
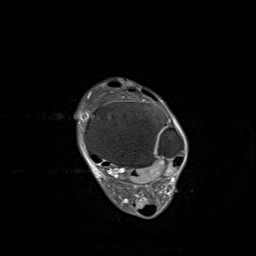
[im 28/32]
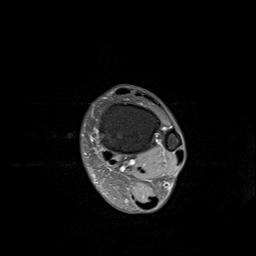
[im 32/32]
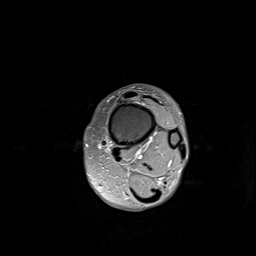

[Series 6: T1 · sagittal · 4.0mm · 0.50mm/px · 6 of 20 slices shown]
[im 1/20]
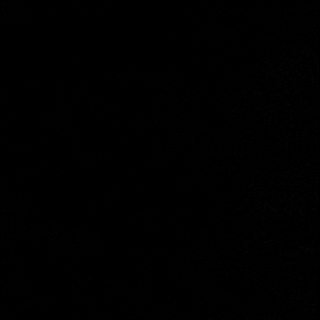
[im 4/20]
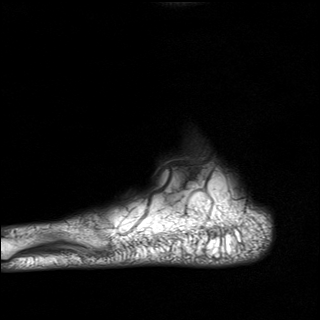
[im 8/20]
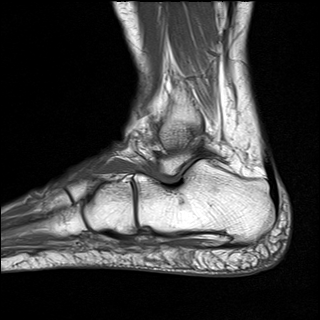
[im 12/20]
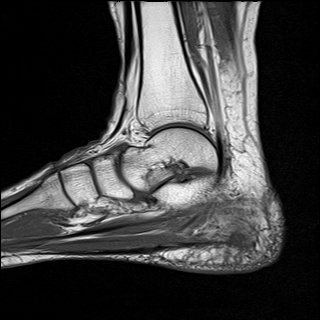
[im 16/20]
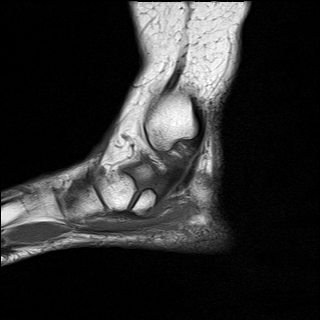
[im 20/20]
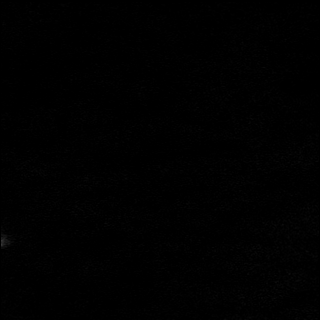

[Series 7: STIR · sagittal · 4.0mm · 0.31mm/px · 6 of 20 slices shown]
[im 1/20]
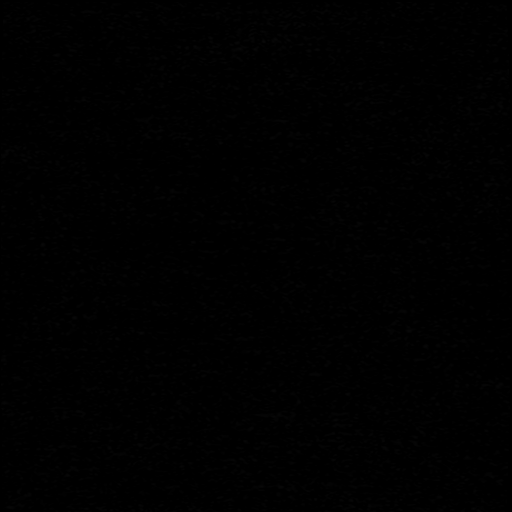
[im 4/20]
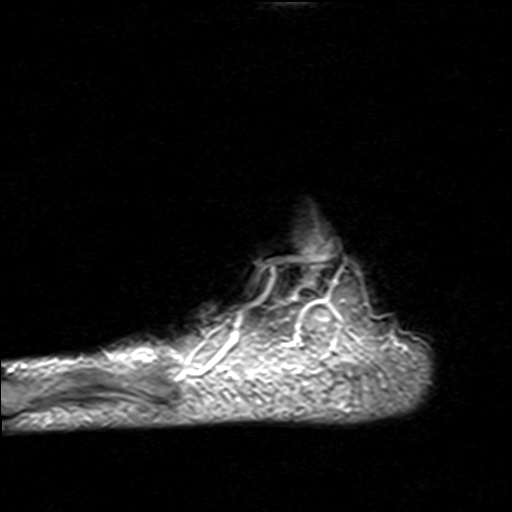
[im 8/20]
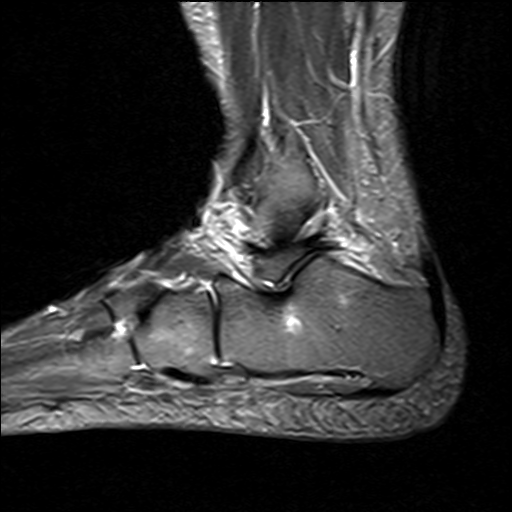
[im 12/20]
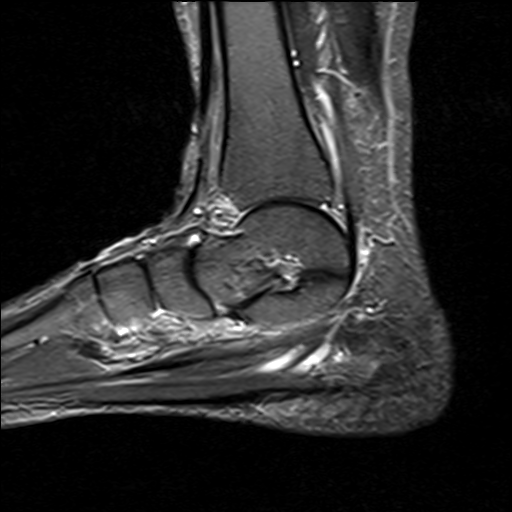
[im 16/20]
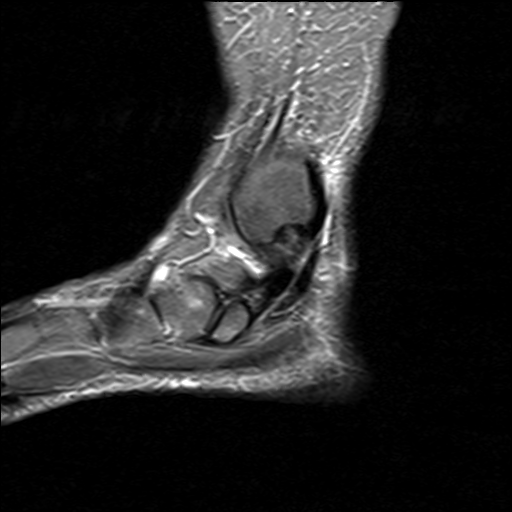
[im 20/20]
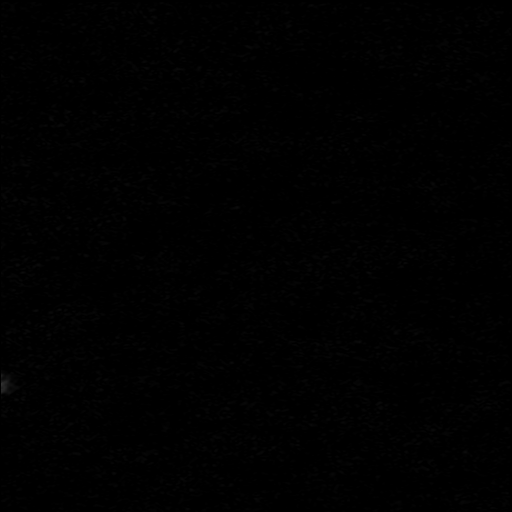

[Series 8: T2 fat-sat · coronal · 3.0mm · 0.50mm/px · 10 of 37 slices shown (2 of 2)]
[im 1/37]
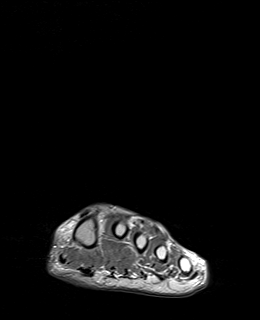
[im 5/37]
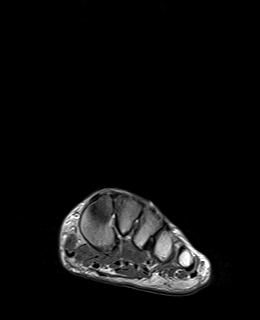
[im 9/37]
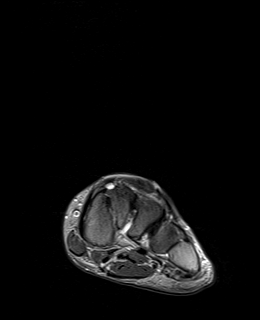
[im 13/37]
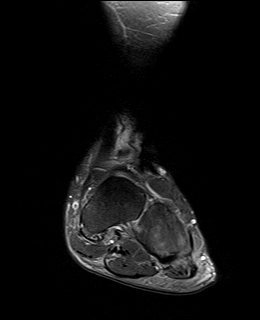
[im 17/37]
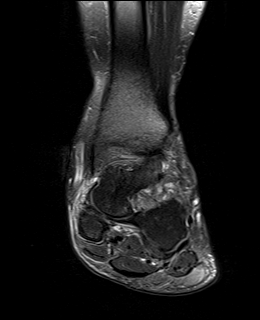
[im 21/37]
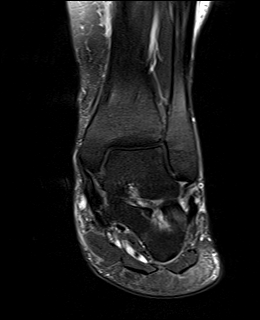
[im 25/37]
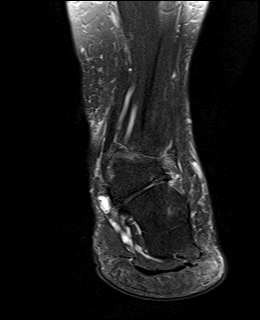
[im 29/37]
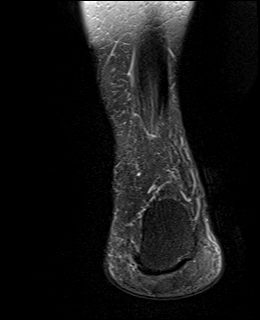
[im 33/37]
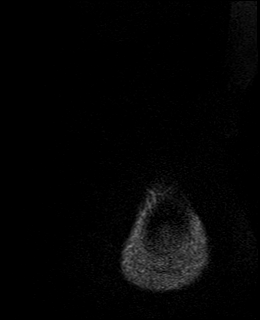
[im 37/37]
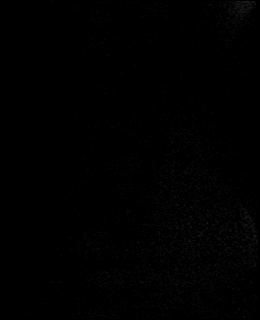

[40 of 40 positions shown; findings below may reference images not displayed]

FINDINGS: TENDONS

Peroneal: Peroneus longus and brevis tendons are intact and normally
positioned. Trace peroneus brevis tenosynovitis.

Posteromedial: Tibialis posterior, flexor hallucis longus, and
flexor digitorum longus tendons are intact and normally positioned.

Anterior: Tibialis anterior, extensor hallucis longus, and extensor
digitorum longus tendons are intact and normally positioned.

Achilles: Intact.

Plantar Fascia: Intact.

LIGAMENTS

Lateral: Intact tibiofibular ligaments. The anterior and posterior
talofibular ligaments are intact. Intact calcaneofibular ligament.

Medial: The deltoid and visualized portions of the spring ligament
appear intact.

CARTILAGE AND BONES

Ankle Joint: No tibiotalar joint effusion. The talar dome and tibial
plafond are intact.

Subtalar Joints/Sinus Tarsi: No cartilage defect. No effusion. Soft
tissue edema within the sinus tarsi with loss of the anatomic fat
signal.

Bones: No acute fracture. Pes planovalgus alignment. Type 2
accessory navicular with preservation of bone marrow signal in the
ossicle and navicular. No marrow signal abnormality. No suspicious
bone lesion.

Other: 10 x 5 x 9 mm cyst adjacent to the lateral margin of the
posterior subtalar joint (series 4, image 17).
IMPRESSION: 1. No acute osseous abnormality of the left ankle.
2. Soft tissue edema within the sinus tarsi with loss of the
anatomic fat signal. Findings can be seen in the setting of sinus
tarsi syndrome.
3. Pes planovalgus alignment.
4. Trace peroneus brevis tenosynovitis.
5. Small ganglion cyst adjacent to the posterior subtalar joint.
